# Patient Record
Sex: Female | Born: 1960
Health system: Southern US, Community
[De-identification: ages and names within clinical notes are randomized; demographics above are authoritative.]

## PROBLEM LIST (undated history)

## (undated) DIAGNOSIS — D126 Benign neoplasm of colon, unspecified: Secondary | ICD-10-CM

## (undated) DIAGNOSIS — K219 Gastro-esophageal reflux disease without esophagitis: Secondary | ICD-10-CM

## (undated) DIAGNOSIS — Z9109 Other allergy status, other than to drugs and biological substances: Secondary | ICD-10-CM

## (undated) DIAGNOSIS — R011 Cardiac murmur, unspecified: Secondary | ICD-10-CM

## (undated) DIAGNOSIS — E785 Hyperlipidemia, unspecified: Secondary | ICD-10-CM

## (undated) DIAGNOSIS — T7840XA Allergy, unspecified, initial encounter: Secondary | ICD-10-CM

## (undated) DIAGNOSIS — I10 Essential (primary) hypertension: Secondary | ICD-10-CM

## (undated) HISTORY — DX: Benign neoplasm of colon, unspecified: D12.6

## (undated) HISTORY — PX: UTERINE FIBROID SURGERY: SHX826

## (undated) HISTORY — DX: Cardiac murmur, unspecified: R01.1

## (undated) HISTORY — DX: Allergy, unspecified, initial encounter: T78.40XA

## (undated) HISTORY — PX: COLONOSCOPY W/ POLYPECTOMY: SHX1380

## (undated) HISTORY — DX: Hyperlipidemia, unspecified: E78.5

## (undated) HISTORY — DX: Other allergy status, other than to drugs and biological substances: Z91.09

## (undated) HISTORY — DX: Gastro-esophageal reflux disease without esophagitis: K21.9

## (undated) HISTORY — DX: Essential (primary) hypertension: I10

---

## 1998-05-18 ENCOUNTER — Ambulatory Visit (HOSPITAL_COMMUNITY): Admission: RE | Admit: 1998-05-18 | Discharge: 1998-05-18 | Payer: Self-pay | Admitting: Obstetrics & Gynecology

## 2000-06-19 ENCOUNTER — Other Ambulatory Visit: Admission: RE | Admit: 2000-06-19 | Discharge: 2000-06-19 | Payer: Self-pay | Admitting: Obstetrics & Gynecology

## 2002-07-17 ENCOUNTER — Other Ambulatory Visit: Admission: RE | Admit: 2002-07-17 | Discharge: 2002-07-17 | Payer: Self-pay | Admitting: Obstetrics & Gynecology

## 2003-07-23 ENCOUNTER — Other Ambulatory Visit: Admission: RE | Admit: 2003-07-23 | Discharge: 2003-07-23 | Payer: Self-pay | Admitting: Obstetrics and Gynecology

## 2003-12-25 ENCOUNTER — Ambulatory Visit (HOSPITAL_COMMUNITY): Admission: RE | Admit: 2003-12-25 | Discharge: 2003-12-25 | Payer: Self-pay | Admitting: Obstetrics and Gynecology

## 2004-08-06 ENCOUNTER — Other Ambulatory Visit: Admission: RE | Admit: 2004-08-06 | Discharge: 2004-08-06 | Payer: Self-pay | Admitting: Obstetrics and Gynecology

## 2005-08-11 ENCOUNTER — Other Ambulatory Visit: Admission: RE | Admit: 2005-08-11 | Discharge: 2005-08-11 | Payer: Self-pay | Admitting: Obstetrics and Gynecology

## 2006-09-12 DIAGNOSIS — R87619 Unspecified abnormal cytological findings in specimens from cervix uteri: Secondary | ICD-10-CM | POA: Insufficient documentation

## 2006-09-20 ENCOUNTER — Encounter: Admission: RE | Admit: 2006-09-20 | Discharge: 2006-09-20 | Payer: Self-pay | Admitting: Obstetrics & Gynecology

## 2013-02-04 ENCOUNTER — Encounter: Payer: Self-pay | Admitting: Internal Medicine

## 2013-03-08 DIAGNOSIS — D126 Benign neoplasm of colon, unspecified: Secondary | ICD-10-CM

## 2013-03-08 HISTORY — DX: Benign neoplasm of colon, unspecified: D12.6

## 2013-03-22 ENCOUNTER — Encounter: Payer: Self-pay | Admitting: Internal Medicine

## 2013-03-22 ENCOUNTER — Ambulatory Visit (AMBULATORY_SURGERY_CENTER): Payer: Managed Care, Other (non HMO)

## 2013-03-22 DIAGNOSIS — Z1211 Encounter for screening for malignant neoplasm of colon: Secondary | ICD-10-CM

## 2013-03-22 MED ORDER — MOVIPREP 100 G PO SOLR: 1.0000 | Freq: Once | ORAL | Status: DC

## 2013-04-04 ENCOUNTER — Ambulatory Visit (AMBULATORY_SURGERY_CENTER): Payer: Managed Care, Other (non HMO) | Admitting: Internal Medicine

## 2013-04-04 ENCOUNTER — Encounter: Payer: Self-pay | Admitting: Internal Medicine

## 2013-04-04 VITALS — BP 134/83 | HR 59 | Temp 98.1°F | Resp 33 | Ht 65.0 in | Wt 195.0 lb

## 2013-04-04 DIAGNOSIS — Z1211 Encounter for screening for malignant neoplasm of colon: Secondary | ICD-10-CM

## 2013-04-04 DIAGNOSIS — D126 Benign neoplasm of colon, unspecified: Secondary | ICD-10-CM

## 2013-04-04 MED ORDER — SODIUM CHLORIDE 0.9 % IV SOLN: 500.0000 mL | INTRAVENOUS | Status: DC

## 2013-04-04 NOTE — Op Note (Signed)
[facility] [address] [place], [address]   COLONOSCOPY PROCEDURE REPORT  PATIENT: [name], [name]  MR#: [id number] BIRTHDATE: [birth year] , 52  yrs. old GENDER: Female ENDOSCOPIST: Beverley Fiedler, MD REFERRED BY: PROCEDURE DATE:  04/04/2013 PROCEDURE:   Colonoscopy with cold biopsy polypectomy and Colonoscopy with snare polypectomy First Screening Colonoscopy - Avg.  risk and is 50 yrs.  old or older Yes.  Prior Negative Screening - Now for repeat screening. N/A  History of Adenoma - Now for follow-up colonoscopy & has been > or = to 3 yrs.  N/A  Polyps Removed Today? Yes. ASA CLASS:   Class II INDICATIONS:average risk screening and first colonoscopy. MEDICATIONS: MAC sedation, administered by CRNA and propofol (Diprivan) 250mg  IV  DESCRIPTION OF PROCEDURE:   After the risks benefits and alternatives of the procedure were thoroughly explained, informed consent was obtained.  A digital rectal exam revealed no rectal mass.   The LB [id number]  endoscope was introduced through the anus and advanced to the cecum, which was identified by both the appendix and ileocecal valve. No adverse events experienced. The quality of the prep was good, using MoviPrep  The instrument was then slowly withdrawn as the colon was fully examined.   COLON FINDINGS: Two sessile polyps measuring 3 mm in size were found at the cecum.  Polypectomy was performed with cold forceps.  All resections were complete and all polyp tissue was completely retrieved.   A sessile polyp measuring 5 mm in size was found in the sigmoid colon.  A polypectomy was performed with a cold snare. The resection was complete and the polyp tissue was completely retrieved.   The colon mucosa was otherwise normal.  Retroflexed views revealed no abnormalities. The time to cecum=4 minutes 10 seconds.  Withdrawal time=9 minutes 03 seconds.  The scope was withdrawn and the procedure completed. COMPLICATIONS:  There were no complications.  ENDOSCOPIC IMPRESSION: 1.   Two sessile polyps measuring 3 mm in size were found at the cecum; Polypectomy was performed with cold forceps 2.   Sessile polyp measuring 5 mm in size was found in the sigmoid colon; polypectomy was performed with a cold snare 3.   The colon mucosa was otherwise normal  RECOMMENDATIONS: 1.  Hold aspirin, aspirin products, and anti-inflammatory medication for 1 week. 2.  Await pathology results 3.  If the polyps removed today are proven to be adenomatous (pre-cancerous) polyps, you will need a repeat colonoscopy in 3-5 years.  Otherwise you should continue to follow colorectal cancer screening guidelines for "routine risk" patients with colonoscopy in 10 years.  You will receive a letter within 1-2 weeks with the results of your biopsy as well as final recommendations.  Please call my office if you have not received a letter after 3 weeks.   eSigned:  Beverley Fiedler, MD 04/04/2013 11:21 AM   cc: Bennie Pierini, NP and Julio Sicks, [facility]   PATIENT NAME:  Glenda Rocha, Glenda Rocha MR#: [id number]

## 2013-04-04 NOTE — Progress Notes (Addendum)
Pt ambulated to bath room prior to discharge with husband at her side. No problems, no distress per pt. ewm  Patient did not experience any of the following events: a burn prior to discharge; a fall within the facility; wrong site/side/patient/procedure/implant event; or a hospital transfer or hospital admission upon discharge from the facility. ([id number])Patient did not have preoperative order for IV antibiotic SSI prophylaxis. [id number]) ewm

## 2013-04-04 NOTE — Progress Notes (Signed)
Report to pacu rn, vss, bbs=clear 

## 2013-04-04 NOTE — Patient Instructions (Addendum)
YOU HAD AN ENDOSCOPIC PROCEDURE TODAY AT THE [facility]: Refer to the procedure report that was given to you for any specific questions about what was found during the examination.  If the procedure report does not answer your questions, please call your gastroenterologist to clarify.  If you requested that your care partner not be given the details of your procedure findings, then the procedure report has been included in a sealed envelope for you to review at your convenience later.  YOU SHOULD EXPECT: Some feelings of bloating in the abdomen. Passage of more gas than usual.  Walking can help get rid of the air that was put into your GI tract during the procedure and reduce the bloating. If you had a lower endoscopy (such as a colonoscopy or flexible sigmoidoscopy) you may notice spotting of blood in your stool or on the toilet paper. If you underwent a bowel prep for your procedure, then you may not have a normal bowel movement for a few days.  DIET: Your first meal following the procedure should be a light meal and then it is ok to progress to your normal diet.  A half-sandwich or bowl of soup is an example of a good first meal.  Heavy or fried foods are harder to digest and may make you feel nauseous or bloated.  Likewise meals heavy in dairy and vegetables can cause extra gas to form and this can also increase the bloating.  Drink plenty of fluids but you should avoid alcoholic beverages for 24 hours.  ACTIVITY: Your care partner should take you home directly after the procedure.  You should plan to take it easy, moving slowly for the rest of the day.  You can resume normal activity the day after the procedure however you should NOT DRIVE or use heavy machinery for 24 hours (because of the sedation medicines used during the test).    SYMPTOMS TO REPORT IMMEDIATELY: A gastroenterologist can be reached at any hour.  During normal business hours, 8:30 AM to 5:00 PM Monday through Friday,  call [phone].  After hours and on weekends, please call the GI answering service at [phone]  Emergency number who will take a message and have the physician on call contact you.   Following lower endoscopy (colonoscopy or flexible sigmoidoscopy):  Excessive amounts of blood in the stool  Significant tenderness or worsening of abdominal pains  Swelling of the abdomen that is new, acute  Fever of 100F or higher  FOLLOW UP: If any biopsies were taken you will be contacted by phone or by letter within the next 1-3 weeks.  Call your gastroenterologist if you have not heard about the biopsies in 3 weeks.  Our staff will call the home number listed on your records the next business day following your procedure to check on you and address any questions or concerns that you may have at that time regarding the information given to you following your procedure. This is a courtesy call and so if there is no answer at the home number and we have not heard from you through the emergency physician on call, we will assume that you have returned to your regular daily activities without incident.  SIGNATURES/CONFIDENTIALITY: You and/or your care partner have signed paperwork which will be entered into your electronic medical record.  These signatures attest to the fact that that the information above on your After Visit Summary has been reviewed and is understood.  Full responsibility of the   confidentiality of this discharge information lies with you and/or your care-partner.  Handout on polyps  Please hold aspirin, all aspirin products, and all anti inflammatory medicines like advil, motrin, aleve, bc powders, goody's  for 1 week. Use tylenol only for pain. You may resume these medicines 04-11-13.

## 2013-04-04 NOTE — Progress Notes (Signed)
Called to room to assist during endoscopic procedure.  Patient ID and intended procedure confirmed with present staff. Received instructions for my participation in the procedure from the performing physician.  

## 2013-04-05 ENCOUNTER — Telehealth: Payer: Self-pay | Admitting: *Deleted

## 2013-04-05 NOTE — Telephone Encounter (Signed)
Name identifier, Left message, follow-up 

## 2013-04-11 ENCOUNTER — Encounter: Payer: Self-pay | Admitting: Internal Medicine

## 2013-04-24 ENCOUNTER — Telehealth: Payer: Self-pay | Admitting: *Deleted

## 2013-04-24 NOTE — Telephone Encounter (Signed)
Pt reports she never received any info about her COLON. Explained path letter to pt and mailed her a copy along with the path and procedure letter.

## 2013-11-21 ENCOUNTER — Ambulatory Visit (INDEPENDENT_AMBULATORY_CARE_PROVIDER_SITE_OTHER): Payer: Managed Care, Other (non HMO) | Admitting: Gastroenterology

## 2013-11-21 ENCOUNTER — Encounter: Payer: Self-pay | Admitting: Gastroenterology

## 2013-11-21 ENCOUNTER — Other Ambulatory Visit (INDEPENDENT_AMBULATORY_CARE_PROVIDER_SITE_OTHER): Payer: Managed Care, Other (non HMO)

## 2013-11-21 ENCOUNTER — Telehealth: Payer: Self-pay | Admitting: Internal Medicine

## 2013-11-21 VITALS — BP 132/74 | HR 70 | Ht 65.0 in | Wt 206.0 lb

## 2013-11-21 DIAGNOSIS — K625 Hemorrhage of anus and rectum: Secondary | ICD-10-CM

## 2013-11-21 DIAGNOSIS — R102 Pelvic and perineal pain: Secondary | ICD-10-CM

## 2013-11-21 LAB — BASIC METABOLIC PANEL
BUN: 18 mg/dL (ref 6–23)
CALCIUM: 9.2 mg/dL (ref 8.4–10.5)
CHLORIDE: 103 meq/L (ref 96–112)
CO2: 29 mEq/L (ref 19–32)
Creatinine, Ser: 1 mg/dL (ref 0.4–1.2)
GFR: 60.08 mL/min (ref >60.00)
Glucose, Bld: 78 mg/dL (ref 70–99)
Potassium: 4.4 mEq/L (ref 3.5–5.1)
SODIUM: 138 meq/L (ref 135–145)

## 2013-11-21 LAB — URINALYSIS
BILIRUBIN URINE: NEGATIVE
Hgb urine dipstick: NEGATIVE
Ketones, ur: NEGATIVE
LEUKOCYTES UA: NEGATIVE
NITRITE: NEGATIVE
PH: 5.5 (ref 5.0–8.0)
Specific Gravity, Urine: 1.025 (ref 1.000–1.030)
Total Protein, Urine: NEGATIVE
URINE GLUCOSE: NEGATIVE
Urobilinogen, UA: 0.2 (ref 0.0–1.0)

## 2013-11-21 LAB — CBC
HEMATOCRIT: 42 % (ref 36.0–46.0)
Hemoglobin: 14.5 g/dL (ref 12.0–15.0)
MCHC: 34.4 g/dL (ref 30.0–36.0)
MCV: 90.1 fl (ref 78.0–100.0)
Platelets: 282 10*3/uL (ref 150.0–400.0)
RBC: 4.66 Mil/uL (ref 3.87–5.11)
RDW: 13.1 % (ref 11.5–14.6)
WBC: 11.4 10*3/uL — AB (ref 4.5–10.5)

## 2013-11-21 MED ORDER — HYDROCORTISONE ACETATE 25 MG RE SUPP: 25.0000 mg | Freq: Two times a day (BID) | RECTAL | Status: DC

## 2013-11-21 NOTE — Telephone Encounter (Signed)
Patient with occasional lower abdominal cramping and rectal bleeding.  She reports bleeding with large BM.  She will come in and see Alonza Bogus, PA today at 3:00

## 2013-11-21 NOTE — Progress Notes (Addendum)
11/21/2013 Glenda Rocha [id number] [birth year]   History of Present Illness:  This is a pleasant 53 year old female who is known to Dr. Hilarie Fredrickson for screening colonoscopy only.  She underwent that procedure in August 2014 at which time she had 2 polyps removed from the cecum and one removed from the sigmoid colon; they were tubular adenomas on pathology.  She comes our office today stating that ever since the colonoscopy she has been experiencing rectal bleeding as described as bright red in color. This occurs with bowel movements only.  It is not with every bowel movement, but occurs approximately one out of 3 bowel movements, usually with larger bowel movements. She denies any rectal pain.  She states that this morning she had 1 episode of a moderate amount of bleeding. She says that she has never experienced problems with rectal bleeding or hemorrhoids in the past.  While she is here she also mentions that she has some lower abdominal cramping/pressure that feels like menstrual cramps. She also says that she has a sensation she needs to urinate and will set on the toilet with a lot of pressure built up but then had a lot of hesitation with her urination. This is very unusual because she normally urinates every hour.  She has an appointment with her GYN next month as well.   Current Medications, Allergies, Past Medical History, Past Surgical History, Family History and Social History were reviewed in [employer] record.   Physical Exam: BP 132/74  Pulse 70  Ht 5\' 5"  (1.651 m)  Wt 206 lb (93.441 kg)  BMI 34.28 kg/m2  LMP 11/10/2013 General: Well developed white female in no acute distress Head: Normocephalic and atraumatic Eyes: Sclerae anicteric, conjunctiva pink  Ears: Normal auditory acuity Lungs: Clear throughout to auscultation Heart: Regular rate and rhythm Abdomen: Soft, non-distended.  Normal bowel sounds.  Diffuse lower abdominal TTP without R/R/G. Rectal:   Small non-bleeding external hemorrhoid noted.  DRE did not reveal any mass and there was some soft stool in the rectal vault.  There was some light brown stool with some light colored blood on the exam glove and it was heme positive.  Anoscopy revealed some internal hemorrhoids but none that looked actively bleeding. Musculoskeletal: Symmetrical with no gross deformities  Extremities: No edema  Neurological: Alert oriented x 4, grossly non-focal Psychological:  Alert and cooperative. Normal mood and affect  Assessment and Recommendations: -Rectal bleeding:  Likely hemorrhoidal (internal hemorrhoids noted on anoscopy, but nothing that looked actively bleeding).  Will treat topically with hydrocortisone suppositories BID for 2 weeks.  Will check CBC. -Pelvic pain and hesitation/discomfort with urination:  Doubt that this is related to her rectal bleeding.  She has an appt with her GYN next month but in the interim we will order transabdominal and transvaginal ultrasounds of the pelvis.  Will check a urinalysis and BMP as well.  *Follow-up in two weeks.  If no improvement in symptoms then may want to consider flex sig to reassess the area.  Addendum: Reviewed and agree with initial management. Agree with the above, and if after the workup as outlined above this is found to be internal hemorrhoids, then she would be a candidate for hemorrhoidal band ligation. However, if that no hemorrhoids were seen at colonoscopy, flex sig may be pursued 1st Jerene Bears, MD

## 2013-11-21 NOTE — Patient Instructions (Addendum)
You have been scheduled for an pelvic and transvaginal ultrasound at [facility] (1st floor of hospital) on 11-22-2013 at 3 pm. Please arrive 15 minutes prior to your appointment for registration. Should you need to reschedule your appointment, please contact radiology at [phone]. This test typically takes about 30 minutes to perform.  Please make sure you have a full bladder _______________________________________________________________________________________ You have a follow up visit scheduled with Janett Billow for 12-05-2013 at 103 am  We have sent the following medications to your pharmacy for you to pick up at your convenience: Anusol Suppositories, please place one suppository rectally twice a day for two weeks   Your physician has requested that you go to the basement for the following lab work before leaving today: Urinalysis CBC BMET

## 2013-11-22 ENCOUNTER — Ambulatory Visit (HOSPITAL_COMMUNITY)
Admission: RE | Admit: 2013-11-22 | Discharge: 2013-11-22 | Disposition: A | Discharge status: Home / Self Care | Payer: Managed Care, Other (non HMO) | Source: Ambulatory Visit | Attending: Gastroenterology | Admitting: Gastroenterology

## 2013-11-22 DIAGNOSIS — N949 Unspecified condition associated with female genital organs and menstrual cycle: Secondary | ICD-10-CM | POA: Insufficient documentation

## 2013-11-22 DIAGNOSIS — D259 Leiomyoma of uterus, unspecified: Secondary | ICD-10-CM | POA: Insufficient documentation

## 2013-11-22 DIAGNOSIS — R102 Pelvic and perineal pain: Secondary | ICD-10-CM

## 2013-11-22 DIAGNOSIS — N854 Malposition of uterus: Secondary | ICD-10-CM | POA: Insufficient documentation

## 2013-11-22 DIAGNOSIS — K625 Hemorrhage of anus and rectum: Secondary | ICD-10-CM

## 2013-12-05 ENCOUNTER — Ambulatory Visit: Payer: Managed Care, Other (non HMO) | Admitting: Gastroenterology

## 2013-12-05 ENCOUNTER — Ambulatory Visit (INDEPENDENT_AMBULATORY_CARE_PROVIDER_SITE_OTHER): Payer: Managed Care, Other (non HMO) | Admitting: Gastroenterology

## 2013-12-05 ENCOUNTER — Encounter: Payer: Self-pay | Admitting: Gastroenterology

## 2013-12-05 VITALS — BP 128/80 | HR 68 | Ht 64.25 in | Wt 206.5 lb

## 2013-12-05 DIAGNOSIS — K625 Hemorrhage of anus and rectum: Secondary | ICD-10-CM

## 2013-12-05 DIAGNOSIS — R102 Pelvic and perineal pain: Secondary | ICD-10-CM

## 2013-12-05 NOTE — Progress Notes (Addendum)
12/05/2013 Glenda Rocha [id number] [birth year]   History of Present Illness:  Patient is a pleasant 53 year old female who is known to Dr. Hilarie Fredrickson for screening colonoscopy in 03/2013 at which time she had 2 polyps removed from the cecum and one removed from the sigmoid colon; they were tubular adenomas on pathology.    She was seen by myself on 4/16 for complaints of rectal bleeding intermittently with bowel movements since the time of her colonoscopy.  She was examined and found to have internal and external hemorrhoids.  Was given hydrocortisone suppositories to use; she says that she picked them up but never used them.  She has been soaking her jacuzzi tub, which has helped and she has not experienced any further bleeding in the past 2 weeks.  CBC was normal.  She was also complaining of lower abdominal cramping/pressure, but U/A and BMP were normal.  Pelvic ultrasounds showed a fibroid.  She has an appt with GYN in a couple of weeks.  She says that her abdominal/pelvic pain has improved since going back on her OCP, however.   Current Medications, Allergies, Past Medical History, Past Surgical History, Family History and Social History were reviewed in [employer] record.   Physical Exam: BP 128/80  Pulse 68  Ht 5' 4.25" (1.632 m)  Wt 206 lb 8 oz (93.668 kg)  BMI 35.17 kg/m2  LMP 11/10/2013 General: Well developed white female in no acute distress Head: Normocephalic and atraumatic Eyes:  Sclerae anicteric, conjunctiva pink  Ears: Normal auditory acuity Lungs: Clear throughout to auscultation Heart: Regular rate and rhythm Abdomen: Soft, non-distended.  Normal bowel sounds.  Non-tender. Musculoskeletal: Symmetrical with no gross deformities  Extremities: No edema  Neurological: Alert oriented x 4, grossly non-focal Psychological:  Alert and cooperative. Normal mood and affect  Assessment and Recommendations: -Rectal bleeding:  Likely hemorrhoidal.  Now  resolved.  Will call us back if bleeding recurs and she does have the hydrocortisone suppositories to use if needed as well. -Pelvic pain:  Likely gynecologic.  Pelvic ultrasound showed a fibroid.  U/A was normal.  Has GYN appointment in a couple of weeks.  Addendum: Reviewed and agree with initial management. Jerene Bears, MD

## 2013-12-05 NOTE — Patient Instructions (Signed)
Please follow up as needed 

## 2014-12-25 ENCOUNTER — Other Ambulatory Visit: Payer: Self-pay | Admitting: Obstetrics and Gynecology

## 2014-12-26 LAB — CYTOLOGY - PAP

## 2015-07-07 ENCOUNTER — Other Ambulatory Visit: Payer: Self-pay | Admitting: *Deleted

## 2015-07-07 DIAGNOSIS — I83812 Varicose veins of left lower extremities with pain: Secondary | ICD-10-CM

## 2015-08-25 ENCOUNTER — Encounter: Payer: Self-pay | Admitting: Vascular Surgery

## 2015-09-02 ENCOUNTER — Ambulatory Visit (INDEPENDENT_AMBULATORY_CARE_PROVIDER_SITE_OTHER): Payer: BLUE CROSS/BLUE SHIELD | Admitting: Vascular Surgery

## 2015-09-02 ENCOUNTER — Ambulatory Visit (HOSPITAL_COMMUNITY)
Admission: RE | Admit: 2015-09-02 | Discharge: 2015-09-02 | Disposition: A | Discharge status: Home / Self Care | Payer: BLUE CROSS/BLUE SHIELD | Source: Ambulatory Visit | Attending: Vascular Surgery | Admitting: Vascular Surgery

## 2015-09-02 ENCOUNTER — Encounter: Payer: Self-pay | Admitting: Vascular Surgery

## 2015-09-02 VITALS — BP 149/87 | HR 82 | Temp 97.0°F | Resp 16 | Ht 64.0 in | Wt 209.0 lb

## 2015-09-02 DIAGNOSIS — I83812 Varicose veins of left lower extremities with pain: Secondary | ICD-10-CM | POA: Diagnosis not present

## 2015-09-02 DIAGNOSIS — I872 Venous insufficiency (chronic) (peripheral): Secondary | ICD-10-CM

## 2015-09-02 DIAGNOSIS — R600 Localized edema: Secondary | ICD-10-CM | POA: Diagnosis present

## 2015-09-02 NOTE — Progress Notes (Signed)
[facility] [place]  Patient name: Glenda Rocha MRN: [id number] DOB: 1961-08-04 Sex: female  REASON FOR CONSULT: varicose veins left lower extremity  HPI: Glenda Rocha is a 55 y.o. female, who has had problems with varicose veins of her left leg for about a year and a half. These have gradually progressed. She has no significant symptoms on the right side. She works sitting at [employer] and works for 8-10 hours a day during the week. She experiences some aching and heaviness in her left leg which is aggravated by sitting and standing and relieved with elevation. She denies any previous history of DVT or phlebitis. Her father's side of the family does have some history of varicose veins.  I have reviewed the records from [facility]. The patient does have a history of hyperlipidemia which has been under good control. In addition the patient has benign essential hypertension which has also been under good control.  Renal function is normal  Past Medical History  Diagnosis Date  . Environmental allergies   . Tubular adenoma of colon 03/2013    Family History  Problem Relation Age of Onset  . Colon cancer Paternal Uncle 41  . Colon cancer Paternal Grandmother 60  . Esophageal cancer Neg Hx   . Rectal cancer Neg Hx   . Stomach cancer Neg Hx     SOCIAL HISTORY: Social History   Social History  . Marital Status: Married    Spouse Name: N/A  . Number of Children: N/A  . Years of Education: N/A   Occupational History  . Not on file.   Social History Main Topics  . Smoking status: Never Smoker   . Smokeless tobacco: Never Used  . Alcohol Use: 4.2 oz/week    7 Glasses of wine per week  . Drug Use: No  . Sexual Activity: Not on file   Other Topics Concern  . Not on file   Social History Narrative    Allergies  Allergen Reactions  . Peanuts [Peanut Oil] Itching  . Sulfa Antibiotics     "turnes me purple head to toe"  . Tomato Itching      Current Outpatient Prescriptions  Medication Sig Dispense Refill  . cetirizine (ZYRTEC) 10 MG tablet Take 10 mg by mouth as needed.     Marland Kitchen guaiFENesin (MUCINEX) 600 MG 12 hr tablet Take 1,200 mg by mouth as needed for congestion.    . Multiple Vitamins-Minerals (ONE-A-DAY WOMENS 50 PLUS PO) Take 1 capsule by mouth daily.    Marland Kitchen OVER THE COUNTER MEDICATION megared omega3 krill oil    . OVER THE COUNTER MEDICATION Ocean saline nasal spray 2 sprays per nostril    . Vitamins-Lipotropics (LIPOFLAVONOID) TABS Take 1 tablet by mouth 2 (two) times daily.    . norethindrone-ethinyl estradiol (TRIPHASIL,CYCLAFEM,ALYACEN) 0.5/0.75/1-35 MG-MCG tablet Take 1 tablet by mouth daily. Reported on 09/02/2015     No current facility-administered medications for this visit.    REVIEW OF SYSTEMS:  [X]  denotes positive finding, [ ]  denotes negative finding Cardiac  Comments:  Chest pain or chest pressure:    Shortness of breath upon exertion:    Short of breath when lying flat:    Irregular heart rhythm:        Vascular    Pain in calf, thigh, or hip brought on by ambulation:    Pain in feet at night that wakes you up from your sleep:     Blood  clot in your veins:    Leg swelling:         Pulmonary    Oxygen at home:    Productive cough:     Wheezing:         Neurologic    Sudden weakness in arms or legs:     Sudden numbness in arms or legs:     Sudden onset of difficulty speaking or slurred speech:    Temporary loss of vision in one eye:     Problems with dizziness:         Gastrointestinal    Blood in stool:     Vomited blood:         Genitourinary    Burning when urinating:     Blood in urine:        Psychiatric    Major depression:         Hematologic    Bleeding problems:    Problems with blood clotting too easily:        Skin    Rashes or ulcers:        Constitutional    Fever or chills:      PHYSICAL EXAM: Filed Vitals:   09/02/15 1259 09/02/15 1311 09/02/15 1313   BP: 166/98 147/99 149/87  Pulse: 78 84 82  Temp: 97 F (36.1 C)    TempSrc: Oral    Resp: 16    Height: 5\' 4"  (1.626 m)    Weight: 209 lb (94.802 kg)    SpO2: 98%      GENERAL: The patient is a well-nourished female, in no acute distress. The vital signs are documented above. CARDIAC: There is a regular rate and rhythm.  VASCULAR: do not detect carotid bruits. She has palpable pedal pulses. She has no significant lower extremity swelling. PULMONARY: There is good air exchange bilaterally without wheezing or rales. ABDOMEN: Soft and non-tender with normal pitched bowel sounds.  MUSCULOSKELETAL: There are no major deformities or cyanosis. NEUROLOGIC: No focal weakness or paresthesias are detected. SKIN: There are no ulcers or rashes noted. She has varicose veins along the lateral aspect of her left thigh and left leg. These are under mildly elevated pressure. PSYCHIATRIC: The patient has a normal affect.  DATA:   LOWER EXTREMITY VENOUS DUPLEX: I have independently interpreted the lower extremity venous duplex of the left lower extremity. There is no evidence of DVT in the left lower extremity. There is reflux in the common femoral vein femoral vein and popliteal vein. There is no reflux in the great saphenous vein or small saphenous vein.  MEDICAL ISSUES:  CHRONIC VENOUS INSUFFICIENCY: This patient has reflux in the deep system only and no reflux in the superficial system. Have discussed the importance of intermittent leg elevation in the proper position for this. In addition I have given her a prescription for knee-high compression stockings with a gradient of 15-20 mmHg. I have encouraged her to avoid prolonged sitting and standing. I'm encouraged her to exercise and walk as much as possible. I've also explained that water aerobics is helpful for patients with venous disease. I'll be happy to see her back at any point in the future if her symptoms progress.   Deitra Mayo [facility] [place]: [phone]

## 2016-01-18 DIAGNOSIS — Z6836 Body mass index (BMI) 36.0-36.9, adult: Secondary | ICD-10-CM | POA: Diagnosis not present

## 2016-01-18 DIAGNOSIS — Z01419 Encounter for gynecological examination (general) (routine) without abnormal findings: Secondary | ICD-10-CM | POA: Diagnosis not present

## 2016-01-18 DIAGNOSIS — E6609 Other obesity due to excess calories: Secondary | ICD-10-CM | POA: Diagnosis not present

## 2016-06-23 DIAGNOSIS — N950 Postmenopausal bleeding: Secondary | ICD-10-CM | POA: Diagnosis not present

## 2016-07-05 DIAGNOSIS — N950 Postmenopausal bleeding: Secondary | ICD-10-CM | POA: Diagnosis not present

## 2016-08-04 DIAGNOSIS — J40 Bronchitis, not specified as acute or chronic: Secondary | ICD-10-CM | POA: Diagnosis not present

## 2016-12-15 DIAGNOSIS — J00 Acute nasopharyngitis [common cold]: Secondary | ICD-10-CM | POA: Diagnosis not present

## 2017-01-27 DIAGNOSIS — Z1231 Encounter for screening mammogram for malignant neoplasm of breast: Secondary | ICD-10-CM | POA: Diagnosis not present

## 2017-01-27 DIAGNOSIS — Z01419 Encounter for gynecological examination (general) (routine) without abnormal findings: Secondary | ICD-10-CM | POA: Diagnosis not present

## 2017-01-27 DIAGNOSIS — Z6835 Body mass index (BMI) 35.0-35.9, adult: Secondary | ICD-10-CM | POA: Diagnosis not present

## 2017-03-10 DIAGNOSIS — Z0000 Encounter for general adult medical examination without abnormal findings: Secondary | ICD-10-CM | POA: Diagnosis not present

## 2017-03-10 DIAGNOSIS — I10 Essential (primary) hypertension: Secondary | ICD-10-CM | POA: Diagnosis not present

## 2017-03-17 DIAGNOSIS — Z0000 Encounter for general adult medical examination without abnormal findings: Secondary | ICD-10-CM | POA: Diagnosis not present

## 2017-03-17 DIAGNOSIS — I8390 Asymptomatic varicose veins of unspecified lower extremity: Secondary | ICD-10-CM | POA: Diagnosis not present

## 2017-03-17 DIAGNOSIS — E668 Other obesity: Secondary | ICD-10-CM | POA: Diagnosis not present

## 2017-03-17 DIAGNOSIS — E784 Other hyperlipidemia: Secondary | ICD-10-CM | POA: Diagnosis not present

## 2017-03-17 DIAGNOSIS — I10 Essential (primary) hypertension: Secondary | ICD-10-CM | POA: Diagnosis not present

## 2017-03-17 DIAGNOSIS — R5383 Other fatigue: Secondary | ICD-10-CM | POA: Diagnosis not present

## 2017-05-12 DIAGNOSIS — Z6835 Body mass index (BMI) 35.0-35.9, adult: Secondary | ICD-10-CM | POA: Diagnosis not present

## 2017-05-12 DIAGNOSIS — J40 Bronchitis, not specified as acute or chronic: Secondary | ICD-10-CM | POA: Diagnosis not present

## 2017-09-04 DIAGNOSIS — J329 Chronic sinusitis, unspecified: Secondary | ICD-10-CM | POA: Diagnosis not present

## 2017-09-04 DIAGNOSIS — R05 Cough: Secondary | ICD-10-CM | POA: Diagnosis not present

## 2017-09-04 DIAGNOSIS — Z6836 Body mass index (BMI) 36.0-36.9, adult: Secondary | ICD-10-CM | POA: Diagnosis not present

## 2017-12-28 ENCOUNTER — Encounter: Payer: Self-pay | Admitting: *Deleted

## 2018-01-28 ENCOUNTER — Encounter: Payer: Self-pay | Admitting: Internal Medicine

## 2018-01-30 DIAGNOSIS — R2240 Localized swelling, mass and lump, unspecified lower limb: Secondary | ICD-10-CM | POA: Diagnosis not present

## 2018-01-30 DIAGNOSIS — I8390 Asymptomatic varicose veins of unspecified lower extremity: Secondary | ICD-10-CM | POA: Diagnosis not present

## 2018-01-30 DIAGNOSIS — Z6836 Body mass index (BMI) 36.0-36.9, adult: Secondary | ICD-10-CM | POA: Diagnosis not present

## 2018-01-30 DIAGNOSIS — M545 Low back pain: Secondary | ICD-10-CM | POA: Diagnosis not present

## 2018-02-01 DIAGNOSIS — Z1231 Encounter for screening mammogram for malignant neoplasm of breast: Secondary | ICD-10-CM | POA: Diagnosis not present

## 2018-02-01 DIAGNOSIS — Z01419 Encounter for gynecological examination (general) (routine) without abnormal findings: Secondary | ICD-10-CM | POA: Diagnosis not present

## 2018-02-01 DIAGNOSIS — Z6831 Body mass index (BMI) 31.0-31.9, adult: Secondary | ICD-10-CM | POA: Diagnosis not present

## 2018-02-01 DIAGNOSIS — Z13820 Encounter for screening for osteoporosis: Secondary | ICD-10-CM | POA: Diagnosis not present

## 2018-04-06 DIAGNOSIS — I10 Essential (primary) hypertension: Secondary | ICD-10-CM | POA: Diagnosis not present

## 2018-04-06 DIAGNOSIS — R82998 Other abnormal findings in urine: Secondary | ICD-10-CM | POA: Diagnosis not present

## 2018-04-06 DIAGNOSIS — Z0000 Encounter for general adult medical examination without abnormal findings: Secondary | ICD-10-CM | POA: Diagnosis not present

## 2018-04-13 DIAGNOSIS — Z1389 Encounter for screening for other disorder: Secondary | ICD-10-CM | POA: Diagnosis not present

## 2018-04-13 DIAGNOSIS — M722 Plantar fascial fibromatosis: Secondary | ICD-10-CM | POA: Diagnosis not present

## 2018-04-13 DIAGNOSIS — E7849 Other hyperlipidemia: Secondary | ICD-10-CM | POA: Diagnosis not present

## 2018-04-13 DIAGNOSIS — E668 Other obesity: Secondary | ICD-10-CM | POA: Diagnosis not present

## 2018-04-13 DIAGNOSIS — Z0000 Encounter for general adult medical examination without abnormal findings: Secondary | ICD-10-CM | POA: Diagnosis not present

## 2018-04-13 DIAGNOSIS — I10 Essential (primary) hypertension: Secondary | ICD-10-CM | POA: Diagnosis not present

## 2018-04-20 DIAGNOSIS — Z1212 Encounter for screening for malignant neoplasm of rectum: Secondary | ICD-10-CM | POA: Diagnosis not present

## 2018-05-04 DIAGNOSIS — H35371 Puckering of macula, right eye: Secondary | ICD-10-CM | POA: Diagnosis not present

## 2018-05-04 DIAGNOSIS — H35341 Macular cyst, hole, or pseudohole, right eye: Secondary | ICD-10-CM | POA: Diagnosis not present

## 2018-08-06 DIAGNOSIS — H35341 Macular cyst, hole, or pseudohole, right eye: Secondary | ICD-10-CM | POA: Diagnosis not present

## 2018-08-17 DIAGNOSIS — Z6836 Body mass index (BMI) 36.0-36.9, adult: Secondary | ICD-10-CM | POA: Diagnosis not present

## 2018-08-17 DIAGNOSIS — J0100 Acute maxillary sinusitis, unspecified: Secondary | ICD-10-CM | POA: Diagnosis not present

## 2018-08-22 DIAGNOSIS — I10 Essential (primary) hypertension: Secondary | ICD-10-CM | POA: Diagnosis not present

## 2018-08-22 DIAGNOSIS — J302 Other seasonal allergic rhinitis: Secondary | ICD-10-CM | POA: Diagnosis not present

## 2018-08-22 DIAGNOSIS — J209 Acute bronchitis, unspecified: Secondary | ICD-10-CM | POA: Diagnosis not present

## 2018-08-22 DIAGNOSIS — Z6836 Body mass index (BMI) 36.0-36.9, adult: Secondary | ICD-10-CM | POA: Diagnosis not present

## 2019-03-07 DIAGNOSIS — Z01419 Encounter for gynecological examination (general) (routine) without abnormal findings: Secondary | ICD-10-CM | POA: Diagnosis not present

## 2019-03-07 DIAGNOSIS — Z1231 Encounter for screening mammogram for malignant neoplasm of breast: Secondary | ICD-10-CM | POA: Diagnosis not present

## 2019-03-07 DIAGNOSIS — Z6836 Body mass index (BMI) 36.0-36.9, adult: Secondary | ICD-10-CM | POA: Diagnosis not present

## 2019-03-25 DIAGNOSIS — H35341 Macular cyst, hole, or pseudohole, right eye: Secondary | ICD-10-CM | POA: Diagnosis not present

## 2019-04-29 DIAGNOSIS — E7849 Other hyperlipidemia: Secondary | ICD-10-CM | POA: Diagnosis not present

## 2019-04-29 DIAGNOSIS — I10 Essential (primary) hypertension: Secondary | ICD-10-CM | POA: Diagnosis not present

## 2019-04-29 DIAGNOSIS — Z0000 Encounter for general adult medical examination without abnormal findings: Secondary | ICD-10-CM | POA: Diagnosis not present

## 2019-05-01 DIAGNOSIS — I10 Essential (primary) hypertension: Secondary | ICD-10-CM | POA: Diagnosis not present

## 2019-05-01 DIAGNOSIS — R82998 Other abnormal findings in urine: Secondary | ICD-10-CM | POA: Diagnosis not present

## 2019-05-01 DIAGNOSIS — Z1212 Encounter for screening for malignant neoplasm of rectum: Secondary | ICD-10-CM | POA: Diagnosis not present

## 2019-05-06 DIAGNOSIS — I8390 Asymptomatic varicose veins of unspecified lower extremity: Secondary | ICD-10-CM | POA: Diagnosis not present

## 2019-05-06 DIAGNOSIS — Z0000 Encounter for general adult medical examination without abnormal findings: Secondary | ICD-10-CM | POA: Diagnosis not present

## 2019-05-06 DIAGNOSIS — E669 Obesity, unspecified: Secondary | ICD-10-CM | POA: Diagnosis not present

## 2019-05-06 DIAGNOSIS — E785 Hyperlipidemia, unspecified: Secondary | ICD-10-CM | POA: Diagnosis not present

## 2019-05-06 DIAGNOSIS — Z1331 Encounter for screening for depression: Secondary | ICD-10-CM | POA: Diagnosis not present

## 2019-05-06 DIAGNOSIS — I10 Essential (primary) hypertension: Secondary | ICD-10-CM | POA: Diagnosis not present

## 2019-05-07 ENCOUNTER — Encounter: Payer: Self-pay | Admitting: Internal Medicine

## 2019-06-24 ENCOUNTER — Encounter: Payer: Self-pay | Admitting: Internal Medicine

## 2019-06-24 ENCOUNTER — Ambulatory Visit (AMBULATORY_SURGERY_CENTER): Payer: Self-pay

## 2019-06-24 ENCOUNTER — Other Ambulatory Visit: Payer: Self-pay

## 2019-06-24 VITALS — Temp 96.6°F | Ht 64.5 in | Wt 216.0 lb

## 2019-06-24 DIAGNOSIS — Z86010 Personal history of colonic polyps: Secondary | ICD-10-CM

## 2019-06-24 MED ORDER — NA SULFATE-K SULFATE-MG SULF 17.5-3.13-1.6 GM/177ML PO SOLN: 1.0000 | Freq: Once | ORAL | 0 refills | Status: AC

## 2019-06-24 NOTE — Progress Notes (Signed)
Denies allergies to eggs or soy products. Denies complication of anesthesia or sedation. Denies use of weight loss medication. Denies use of O2.   Emmi instructions given for colonoscopy.  Covid screening is scheduled for Wednesday 07/03/19 @2 :40 Pm. A 15.00 coupon for Suprep was given to the patient.

## 2019-07-03 ENCOUNTER — Other Ambulatory Visit: Payer: Self-pay | Admitting: Internal Medicine

## 2019-07-03 DIAGNOSIS — Z1159 Encounter for screening for other viral diseases: Secondary | ICD-10-CM | POA: Diagnosis not present

## 2019-07-05 LAB — SARS CORONAVIRUS 2 (TAT 6-24 HRS): SARS Coronavirus 2: NEGATIVE

## 2019-07-08 ENCOUNTER — Encounter: Payer: Self-pay | Admitting: Internal Medicine

## 2019-07-08 ENCOUNTER — Other Ambulatory Visit: Payer: Self-pay

## 2019-07-08 ENCOUNTER — Ambulatory Visit (AMBULATORY_SURGERY_CENTER): Payer: BC Managed Care – PPO | Admitting: Internal Medicine

## 2019-07-08 VITALS — BP 124/79 | HR 61 | Temp 98.6°F | Resp 16 | Ht 64.0 in | Wt 216.0 lb

## 2019-07-08 DIAGNOSIS — D123 Benign neoplasm of transverse colon: Secondary | ICD-10-CM

## 2019-07-08 DIAGNOSIS — Z86010 Personal history of colonic polyps: Secondary | ICD-10-CM | POA: Diagnosis not present

## 2019-07-08 DIAGNOSIS — D122 Benign neoplasm of ascending colon: Secondary | ICD-10-CM

## 2019-07-08 DIAGNOSIS — D120 Benign neoplasm of cecum: Secondary | ICD-10-CM | POA: Diagnosis not present

## 2019-07-08 DIAGNOSIS — Z1211 Encounter for screening for malignant neoplasm of colon: Secondary | ICD-10-CM | POA: Diagnosis not present

## 2019-07-08 MED ORDER — SODIUM CHLORIDE 0.9 % IV SOLN: 500.0000 mL | Freq: Once | INTRAVENOUS | Status: DC

## 2019-07-08 NOTE — Progress Notes (Signed)
Called to room to assist during endoscopic procedure.  Patient ID and intended procedure confirmed with present staff. Received instructions for my participation in the procedure from the performing physician.  

## 2019-07-08 NOTE — Op Note (Signed)
[place] Patient Name: Glenda Rocha Procedure Date: 07/08/2019 10:11 AM MRN: [id number] Endoscopist: Jerene Bears , MD Age: 58 Referring MD:  Date of Birth: 25-Jul-1961 Gender: Female Account #: [id number] Procedure:                Colonoscopy Indications:              High risk colon cancer surveillance: Personal                            history of non-advanced adenoma, Last colonoscopy:                            August 2014 Medicines:                Monitored Anesthesia Care Procedure:                Pre-Anesthesia Assessment:                           - Prior to the procedure, a History and Physical                            was performed, and patient medications and                            allergies were reviewed. The patient's tolerance of                            previous anesthesia was also reviewed. The risks                            and benefits of the procedure and the sedation                            options and risks were discussed with the patient.                            All questions were answered, and informed consent                            was obtained. Prior Anticoagulants: The patient has                            taken no previous anticoagulant or antiplatelet                            agents. ASA Grade Assessment: II - A patient with                            mild systemic disease. After reviewing the risks                            and benefits, the patient was deemed in  satisfactory condition to undergo the procedure.                           After obtaining informed consent, the colonoscope                            was passed under direct vision. Throughout the                            procedure, the patient's blood pressure, pulse, and                            oxygen saturations were monitored continuously. The                            Colonoscope was introduced through the anus and                        advanced to the cecum, identified by appendiceal                            orifice and ileocecal valve. The colonoscopy was                            performed without difficulty. The patient tolerated                            the procedure well. The quality of the bowel                            preparation was good. The ileocecal valve,                            appendiceal orifice, and rectum were photographed. Scope In: 10:20:07 AM Scope Out: 10:38:10 AM Scope Withdrawal Time: 0 hours 15 minutes 29 seconds  Total Procedure Duration: 0 hours 18 minutes 3 seconds  Findings:                 The digital rectal exam was normal.                           Three sessile polyps were found in the cecum. The                            polyps were 2 to 3 mm in size. These polyps were                            removed with a cold snare. Resection and retrieval                            were complete.                           A 5 mm polyp was found in the ascending colon. The  polyp was sessile. The polyp was removed with a                            cold snare. Resection and retrieval were complete.                           A 20 mm polyp was found in the transverse colon.                            The polyp was flat. The polyp was removed with a                            piecemeal technique using a cold snare. Resection                            and retrieval were complete.                           The exam was otherwise without abnormality on                            direct and retroflexion views. Complications:            No immediate complications. Estimated Blood Loss:     Estimated blood loss was minimal. Impression:               - Three 2 to 3 mm polyps in the cecum, removed with                            a cold snare. Resected and retrieved.                           - One 5 mm polyp in the ascending colon, removed                             with a cold snare. Resected and retrieved.                           - One 20 mm polyp in the transverse colon, removed                            piecemeal using a cold snare. Resected and                            retrieved.                           - The examination was otherwise normal on direct                            and retroflexion views. Recommendation:           - Patient has a contact number available for  emergencies. The signs and symptoms of potential                            delayed complications were discussed with the                            patient. Return to normal activities tomorrow.                            Written discharge instructions were provided to the                            patient.                           - Resume previous diet.                           - Continue present medications.                           - Await pathology results.                           - Repeat colonoscopy is recommended for                            surveillance. The colonoscopy date will be                            determined after pathology results from today's                            exam become available for review. Jerene Bears, MD 07/08/2019 10:40:54 AM This report has been signed electronically.

## 2019-07-08 NOTE — Progress Notes (Signed)
Pt's states no medical or surgical changes since previsit or office visit. 

## 2019-07-08 NOTE — Progress Notes (Signed)
Report to PACU, RN, vss, BBS= Clear.  

## 2019-07-08 NOTE — Patient Instructions (Signed)
HANDOUTS PROVIDED ON: POLYPS  THE POLYPS TAKEN TODAY HAVE BEEN SENT FOR PATHOLOGY.  THE RESULTS CAN TAKE 2-3 WEEKS TO RECEIVE.  BASED ON THE RESULTS IS WHEN YOUR NEXT COLONOSCOPY WILL BE RECOMMENDED.  YOU MAY RESUME YOUR PREVIOUS DIET AND MEDICATION SCHEDULE.  [place] YOU FOR ALLOWING [place] TO CARE FOR YOU TODAY!!!  YOU HAD AN ENDOSCOPIC PROCEDURE TODAY AT [facility]:   Refer to the procedure report that was given to you for any specific questions about what was found during the examination.  If the procedure report does not answer your questions, please call your gastroenterologist to clarify.  If you requested that your care partner not be given the details of your procedure findings, then the procedure report has been included in a sealed envelope for you to review at your convenience later.  YOU SHOULD EXPECT: Some feelings of bloating in the abdomen. Passage of more gas than usual.  Walking can help get rid of the air that was put into your GI tract during the procedure and reduce the bloating. If you had a lower endoscopy (such as a colonoscopy or flexible sigmoidoscopy) you may notice spotting of blood in your stool or on the toilet paper. If you underwent a bowel prep for your procedure, you may not have a normal bowel movement for a few days.  Please Note:  You might notice some irritation and congestion in your nose or some drainage.  This is from the oxygen used during your procedure.  There is no need for concern and it should clear up in a day or so.  SYMPTOMS TO REPORT IMMEDIATELY:   Following lower endoscopy (colonoscopy or flexible sigmoidoscopy):  Excessive amounts of blood in the stool  Significant tenderness or worsening of abdominal pains  Swelling of the abdomen that is new, acute  Fever of 100F or higher  For urgent or emergent issues, a gastroenterologist can be reached at any hour by calling [phone].   DIET:  We do recommend a small meal at  first, but then you may proceed to your regular diet.  Drink plenty of fluids but you should avoid alcoholic beverages for 24 hours.  ACTIVITY:  You should plan to take it easy for the rest of today and you should NOT DRIVE or use heavy machinery until tomorrow (because of the sedation medicines used during the test).    FOLLOW UP: Our staff will call the number listed on your records 48-72 hours following your procedure to check on you and address any questions or concerns that you may have regarding the information given to you following your procedure. If we do not reach you, we will leave a message.  We will attempt to reach you two times.  During this call, we will ask if you have developed any symptoms of COVID 19. If you develop any symptoms (ie: fever, flu-like symptoms, shortness of breath, cough etc.) before then, please call [phone].  If you test positive for Covid 19 in the 2 weeks post procedure, please call and report this information to [place].    If any biopsies were taken you will be contacted by phone or by letter within the next 1-3 weeks.  Please call us at [phone] if you have not heard about the biopsies in 3 weeks.    SIGNATURES/CONFIDENTIALITY: You and/or your care partner have signed paperwork which will be entered into your electronic medical record.  These signatures attest to the fact that that  the information above on your After Visit Summary has been reviewed and is understood.  Full responsibility of the confidentiality of this discharge information lies with you and/or your care-partner.

## 2019-07-10 ENCOUNTER — Telehealth: Payer: Self-pay

## 2019-07-10 NOTE — Telephone Encounter (Signed)
1st follow up call made.  NALM 

## 2019-07-10 NOTE — Telephone Encounter (Signed)
Second attempt call back to pt, lm on vm.

## 2019-07-14 ENCOUNTER — Encounter: Payer: Self-pay | Admitting: Internal Medicine

## 2019-09-30 DIAGNOSIS — H35341 Macular cyst, hole, or pseudohole, right eye: Secondary | ICD-10-CM | POA: Diagnosis not present

## 2020-04-01 DIAGNOSIS — Z6834 Body mass index (BMI) 34.0-34.9, adult: Secondary | ICD-10-CM | POA: Diagnosis not present

## 2020-04-01 DIAGNOSIS — Z1231 Encounter for screening mammogram for malignant neoplasm of breast: Secondary | ICD-10-CM | POA: Diagnosis not present

## 2020-04-01 DIAGNOSIS — Z01419 Encounter for gynecological examination (general) (routine) without abnormal findings: Secondary | ICD-10-CM | POA: Diagnosis not present

## 2020-04-21 DIAGNOSIS — H35341 Macular cyst, hole, or pseudohole, right eye: Secondary | ICD-10-CM | POA: Diagnosis not present

## 2020-05-11 DIAGNOSIS — Z13820 Encounter for screening for osteoporosis: Secondary | ICD-10-CM | POA: Diagnosis not present

## 2020-06-08 DIAGNOSIS — Z0000 Encounter for general adult medical examination without abnormal findings: Secondary | ICD-10-CM | POA: Diagnosis not present

## 2020-06-08 DIAGNOSIS — E785 Hyperlipidemia, unspecified: Secondary | ICD-10-CM | POA: Diagnosis not present

## 2020-06-15 DIAGNOSIS — Z0000 Encounter for general adult medical examination without abnormal findings: Secondary | ICD-10-CM | POA: Diagnosis not present

## 2020-06-15 DIAGNOSIS — R82998 Other abnormal findings in urine: Secondary | ICD-10-CM | POA: Diagnosis not present

## 2020-06-15 DIAGNOSIS — I10 Essential (primary) hypertension: Secondary | ICD-10-CM | POA: Diagnosis not present

## 2020-06-17 ENCOUNTER — Other Ambulatory Visit: Payer: Self-pay | Admitting: Internal Medicine

## 2020-06-17 DIAGNOSIS — E785 Hyperlipidemia, unspecified: Secondary | ICD-10-CM

## 2020-07-06 DIAGNOSIS — Z1212 Encounter for screening for malignant neoplasm of rectum: Secondary | ICD-10-CM | POA: Diagnosis not present

## 2020-07-08 ENCOUNTER — Ambulatory Visit
Admission: RE | Admit: 2020-07-08 | Discharge: 2020-07-08 | Disposition: A | Discharge status: Home / Self Care | Payer: No Typology Code available for payment source | Source: Ambulatory Visit | Attending: Internal Medicine | Admitting: Internal Medicine

## 2020-07-08 DIAGNOSIS — E785 Hyperlipidemia, unspecified: Secondary | ICD-10-CM

## 2020-08-12 DIAGNOSIS — I10 Essential (primary) hypertension: Secondary | ICD-10-CM | POA: Diagnosis not present

## 2020-09-21 DIAGNOSIS — H35371 Puckering of macula, right eye: Secondary | ICD-10-CM | POA: Diagnosis not present

## 2020-09-21 DIAGNOSIS — H35341 Macular cyst, hole, or pseudohole, right eye: Secondary | ICD-10-CM | POA: Diagnosis not present

## 2020-09-21 DIAGNOSIS — H43811 Vitreous degeneration, right eye: Secondary | ICD-10-CM | POA: Diagnosis not present

## 2020-11-26 DIAGNOSIS — H9120 Sudden idiopathic hearing loss, unspecified ear: Secondary | ICD-10-CM | POA: Insufficient documentation

## 2020-11-26 DIAGNOSIS — Z6833 Body mass index (BMI) 33.0-33.9, adult: Secondary | ICD-10-CM | POA: Diagnosis not present

## 2020-11-26 DIAGNOSIS — H6982 Other specified disorders of Eustachian tube, left ear: Secondary | ICD-10-CM | POA: Diagnosis not present

## 2020-12-01 ENCOUNTER — Other Ambulatory Visit (HOSPITAL_COMMUNITY): Payer: Self-pay | Admitting: Internal Medicine

## 2020-12-01 DIAGNOSIS — G4452 New daily persistent headache (NDPH): Secondary | ICD-10-CM

## 2020-12-01 DIAGNOSIS — H9192 Unspecified hearing loss, left ear: Secondary | ICD-10-CM | POA: Diagnosis not present

## 2020-12-01 DIAGNOSIS — H9123 Sudden idiopathic hearing loss, bilateral: Secondary | ICD-10-CM

## 2020-12-02 ENCOUNTER — Ambulatory Visit (HOSPITAL_COMMUNITY)
Admission: RE | Admit: 2020-12-02 | Discharge: 2020-12-02 | Disposition: A | Discharge status: Home / Self Care | Payer: BC Managed Care – PPO | Source: Ambulatory Visit | Attending: Internal Medicine | Admitting: Internal Medicine

## 2020-12-02 ENCOUNTER — Other Ambulatory Visit: Payer: Self-pay

## 2020-12-02 DIAGNOSIS — H9123 Sudden idiopathic hearing loss, bilateral: Secondary | ICD-10-CM | POA: Insufficient documentation

## 2020-12-02 DIAGNOSIS — R42 Dizziness and giddiness: Secondary | ICD-10-CM | POA: Diagnosis not present

## 2020-12-02 DIAGNOSIS — R519 Headache, unspecified: Secondary | ICD-10-CM | POA: Diagnosis not present

## 2020-12-02 DIAGNOSIS — H9190 Unspecified hearing loss, unspecified ear: Secondary | ICD-10-CM | POA: Diagnosis not present

## 2020-12-02 DIAGNOSIS — J019 Acute sinusitis, unspecified: Secondary | ICD-10-CM | POA: Diagnosis not present

## 2020-12-02 DIAGNOSIS — G4452 New daily persistent headache (NDPH): Secondary | ICD-10-CM | POA: Diagnosis not present

## 2020-12-02 MED ORDER — GADOBUTROL 1 MMOL/ML IV SOLN
10.0000 mL | Freq: Once | INTRAVENOUS | Status: AC | PRN
  Administered 2020-12-02: 10 mL via INTRAVENOUS

## 2020-12-03 DIAGNOSIS — H903 Sensorineural hearing loss, bilateral: Secondary | ICD-10-CM | POA: Diagnosis not present

## 2020-12-03 DIAGNOSIS — H9120 Sudden idiopathic hearing loss, unspecified ear: Secondary | ICD-10-CM | POA: Insufficient documentation

## 2020-12-03 DIAGNOSIS — H9122 Sudden idiopathic hearing loss, left ear: Secondary | ICD-10-CM | POA: Diagnosis not present

## 2020-12-16 DIAGNOSIS — H9192 Unspecified hearing loss, left ear: Secondary | ICD-10-CM | POA: Diagnosis not present

## 2020-12-16 DIAGNOSIS — I10 Essential (primary) hypertension: Secondary | ICD-10-CM | POA: Diagnosis not present

## 2020-12-17 DIAGNOSIS — H9042 Sensorineural hearing loss, unilateral, left ear, with unrestricted hearing on the contralateral side: Secondary | ICD-10-CM | POA: Diagnosis not present

## 2020-12-17 DIAGNOSIS — H9120 Sudden idiopathic hearing loss, unspecified ear: Secondary | ICD-10-CM | POA: Diagnosis not present

## 2020-12-31 DIAGNOSIS — I10 Essential (primary) hypertension: Secondary | ICD-10-CM | POA: Diagnosis not present

## 2020-12-31 DIAGNOSIS — R5383 Other fatigue: Secondary | ICD-10-CM | POA: Diagnosis not present

## 2020-12-31 DIAGNOSIS — U071 COVID-19: Secondary | ICD-10-CM | POA: Diagnosis not present

## 2021-01-15 DIAGNOSIS — I10 Essential (primary) hypertension: Secondary | ICD-10-CM | POA: Diagnosis not present

## 2021-01-15 DIAGNOSIS — E785 Hyperlipidemia, unspecified: Secondary | ICD-10-CM | POA: Diagnosis not present

## 2021-01-19 DIAGNOSIS — H9120 Sudden idiopathic hearing loss, unspecified ear: Secondary | ICD-10-CM | POA: Diagnosis not present

## 2021-01-19 DIAGNOSIS — H903 Sensorineural hearing loss, bilateral: Secondary | ICD-10-CM | POA: Diagnosis not present

## 2021-03-22 DIAGNOSIS — I10 Essential (primary) hypertension: Secondary | ICD-10-CM | POA: Diagnosis not present

## 2021-03-22 DIAGNOSIS — E785 Hyperlipidemia, unspecified: Secondary | ICD-10-CM | POA: Diagnosis not present

## 2021-04-30 DIAGNOSIS — Z01419 Encounter for gynecological examination (general) (routine) without abnormal findings: Secondary | ICD-10-CM | POA: Diagnosis not present

## 2021-04-30 DIAGNOSIS — Z1231 Encounter for screening mammogram for malignant neoplasm of breast: Secondary | ICD-10-CM | POA: Diagnosis not present

## 2021-04-30 DIAGNOSIS — Z6835 Body mass index (BMI) 35.0-35.9, adult: Secondary | ICD-10-CM | POA: Diagnosis not present

## 2021-07-08 DIAGNOSIS — E785 Hyperlipidemia, unspecified: Secondary | ICD-10-CM | POA: Diagnosis not present

## 2021-07-15 DIAGNOSIS — I10 Essential (primary) hypertension: Secondary | ICD-10-CM | POA: Diagnosis not present

## 2021-07-15 DIAGNOSIS — R82998 Other abnormal findings in urine: Secondary | ICD-10-CM | POA: Diagnosis not present

## 2021-07-15 DIAGNOSIS — Z0000 Encounter for general adult medical examination without abnormal findings: Secondary | ICD-10-CM | POA: Diagnosis not present

## 2021-08-19 DIAGNOSIS — Z411 Encounter for cosmetic surgery: Secondary | ICD-10-CM | POA: Diagnosis not present

## 2021-08-25 DIAGNOSIS — I10 Essential (primary) hypertension: Secondary | ICD-10-CM | POA: Diagnosis not present

## 2021-11-11 IMAGING — MR MR HEAD WO/W CM
14 series · 48 of 48 positions shown · IV contrast (gadavist)
Comparison: No pertinent prior exams available for comparison.

CLINICAL DATA: Sudden hearing loss of both ears. New daily
persistent headache. Additional history provided: Patient reports
sudden onset hearing loss on the left with slight hearing loss on
the right, dizziness since [REDACTED], persistent daily headache.

EXAM:
MRI HEAD WITHOUT AND WITH CONTRAST
TECHNIQUE: Multiplanar, multiecho pulse sequences of the brain and surrounding
structures were obtained without and with intravenous contrast.
CONTRAST:  10mL GADAVIST GADOBUTROL 1 MMOL/ML IV SOLN

[Series 5: DWI · axial · 3.0mm · 1.36mm/px · z∈[-60,+93]mm · 8 of 104 slices shown (1 of 2)]
[im 1/104]
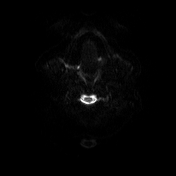
[im 15/104]
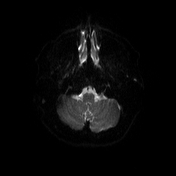
[im 30/104]
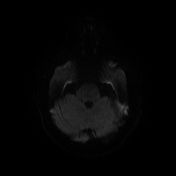
[im 45/104]
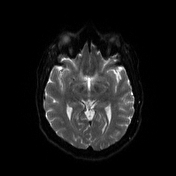
[im 59/104]
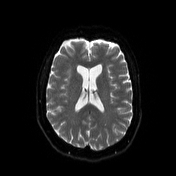
[im 74/104]
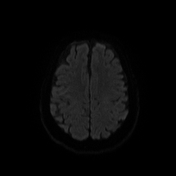
[im 89/104]
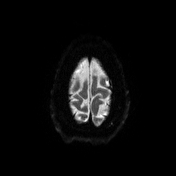
[im 104/104]
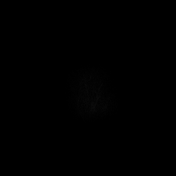

[Series 6: DWI · axial · 3.0mm · 1.36mm/px · z∈[-60,+93]mm · 4 of 52 slices shown (2 of 2)]
[im 1/52]
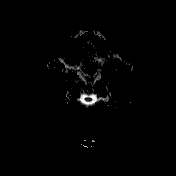
[im 18/52]
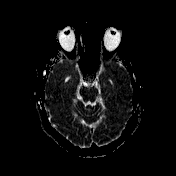
[im 35/52]
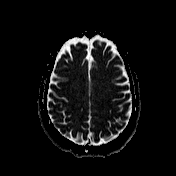
[im 52/52]
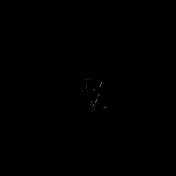

[Series 7: T1 · sagittal · 5.0mm · 0.75mm/px · 2 of 24 slices shown (1 of 3)]
[im 1/24]
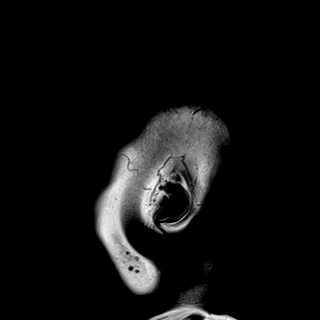
[im 24/24]
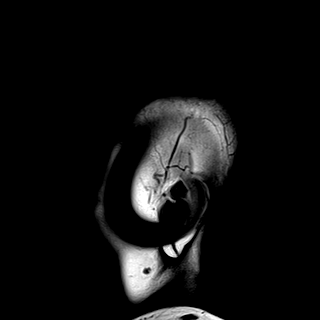

[Series 8: T2 · axial · 5.0mm · 0.62mm/px · z∈[-59,+103]mm · 2 of 26 slices shown]
[im 1/26]
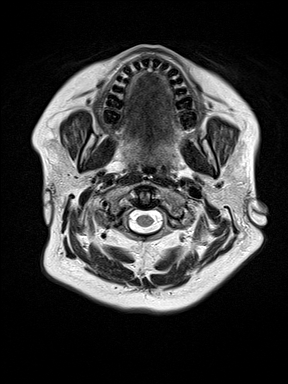
[im 26/26]
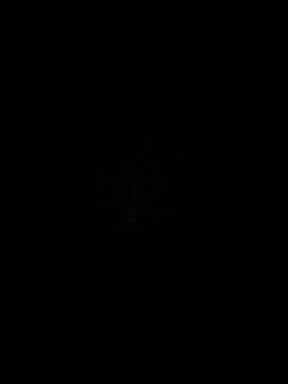

[Series 9: mip_images(sw) · axial · 24.0mm · 0.75mm/px · z∈[-44,+88]mm · 3 of 45 slices shown]
[im 1/45]
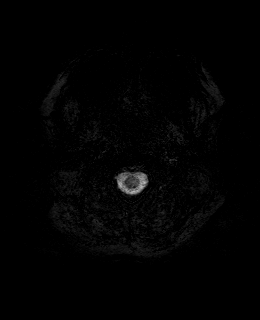
[im 23/45]
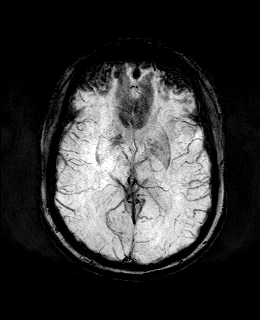
[im 45/45]
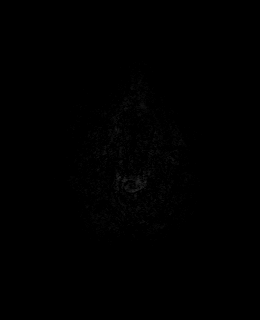

[Series 10: swi_images · axial · 3.0mm · 0.75mm/px · z∈[-54,+99]mm · 4 of 52 slices shown]
[im 1/52]
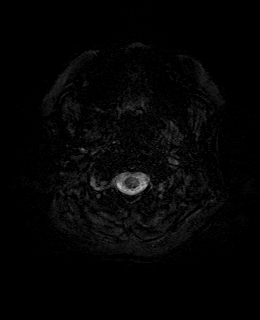
[im 18/52]
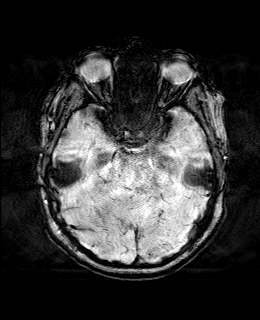
[im 35/52]
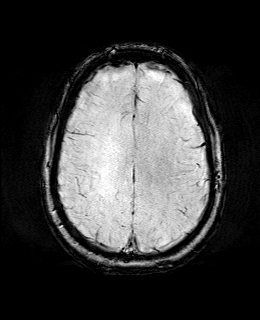
[im 52/52]
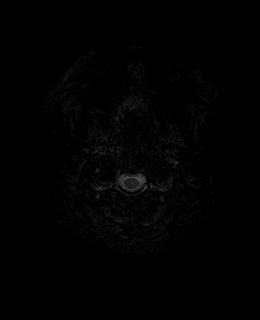

[Series 11: FLAIR · axial · 3.0mm · 0.75mm/px · z∈[-54,+99]mm · 4 of 52 slices shown]
[im 1/52]
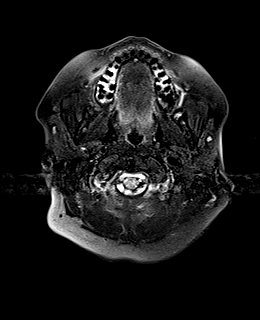
[im 18/52]
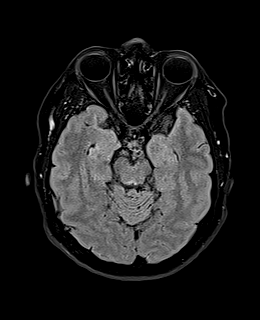
[im 35/52]
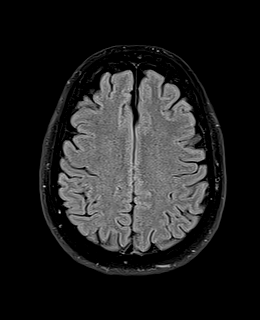
[im 52/52]
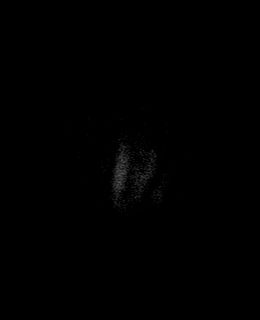

[Series 12: bSSFP · axial · 0.6mm · 0.56mm/px · z∈[-46,-13]mm · 4 of 56 slices shown]
[im 1/56]
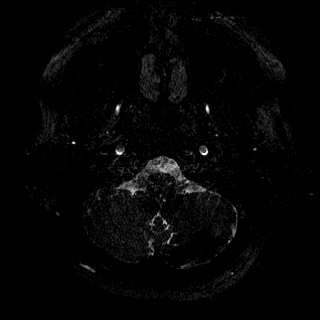
[im 19/56]
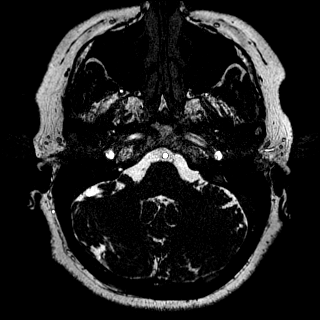
[im 37/56]
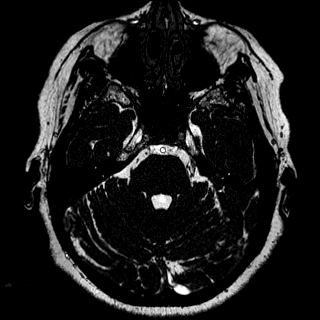
[im 56/56]
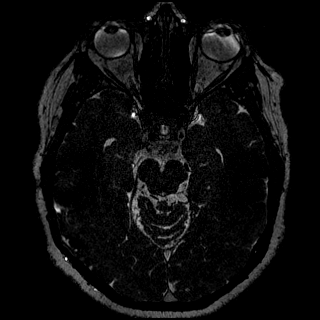

[Series 13: T1 · axial · 3.0mm · 0.31mm/px · 1 of 15 slices shown (2 of 3)]
[im 1/15]
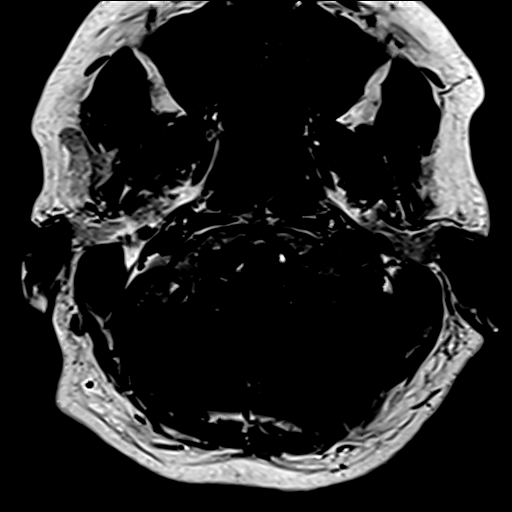

[Series 14: T1 · coronal · 3.0mm · 0.50mm/px · 1 of 15 slices shown (3 of 3)]
[im 1/15]
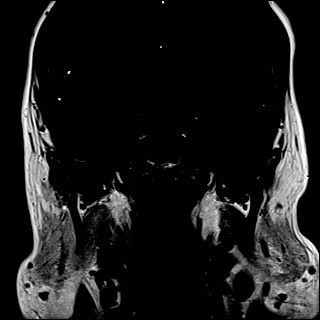

[Series 15: T1 post-contrast · coronal · 3.0mm · 0.50mm/px · 1 of 15 slices shown (1 of 3)]
[im 1/15]
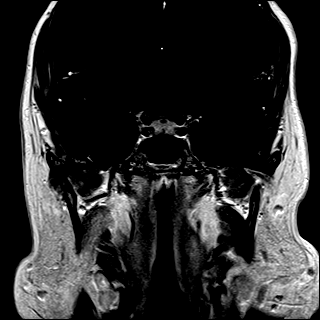

[Series 16: T1 post-contrast · axial · 3.0mm · 0.31mm/px · 1 of 15 slices shown (2 of 3)]
[im 1/15]
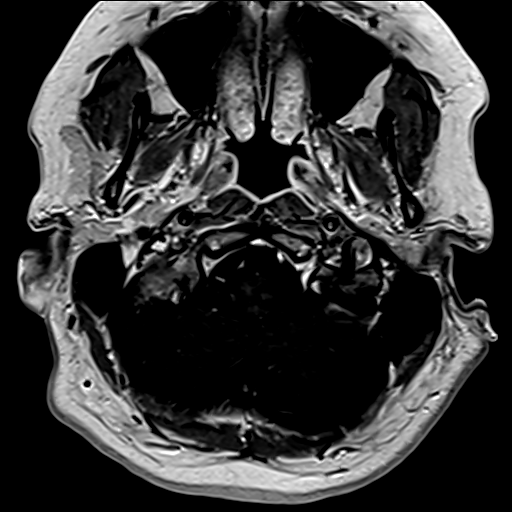

[Series 17: T1 post-contrast · axial · 1.0mm · 0.94mm/px · z∈[-57,+102]mm · 12 of 160 slices shown (3 of 3)]
[im 1/160]
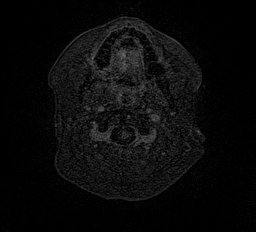
[im 15/160]
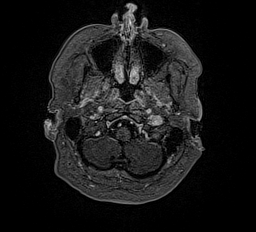
[im 29/160]
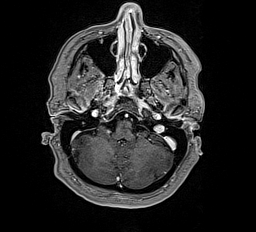
[im 44/160]
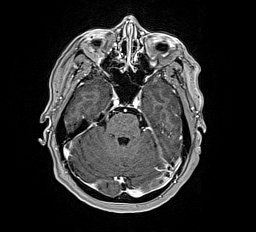
[im 58/160]
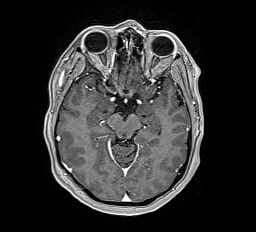
[im 73/160]
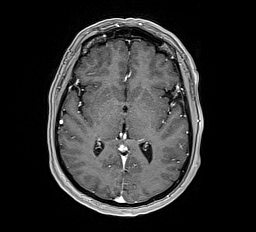
[im 87/160]
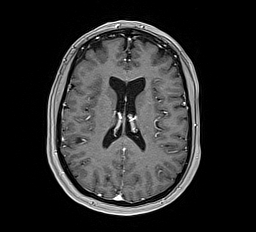
[im 102/160]
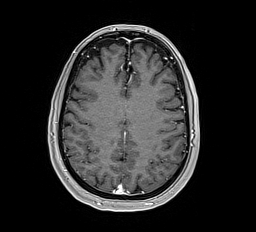
[im 116/160]
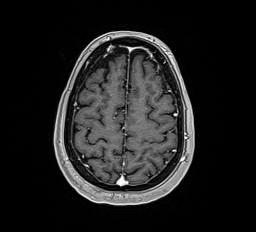
[im 131/160]
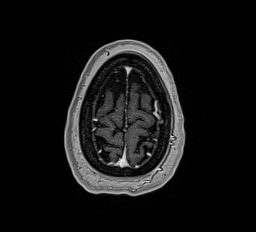
[im 145/160]
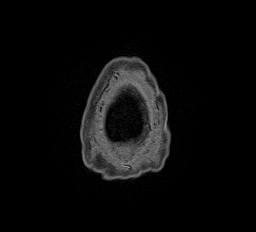
[im 160/160]
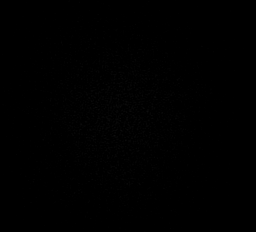

[Series 100: <mpr range> · axial · 0.6mm · 0.37mm/px · 1 of 13 slices shown]
[im 1/13]
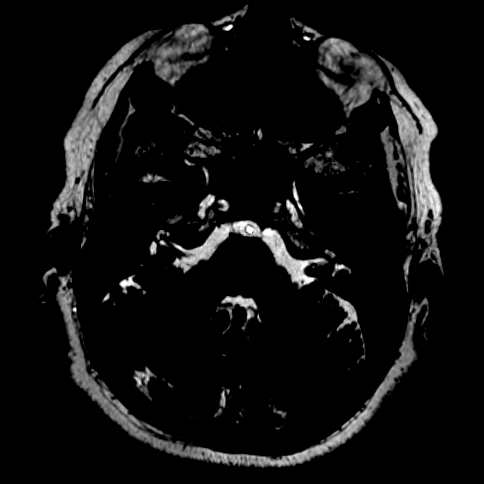

[48 of 48 positions shown; findings below may reference images not displayed]

FINDINGS: Brain:

Cerebral volume is normal.

No focal parenchymal signal abnormality is identified.

No evidence of an intracranial mass. Specifically, no
cerebellopontine angle or internal auditory canal mass is
demonstrated. Unremarkable appearance of the seventh and eighth
cranial nerves bilaterally.

There is no acute infarct.

No evidence of intracranial mass.

No chronic intracranial blood products.

No extra-axial fluid collection.

No midline shift.

No abnormal intracranial enhancement.

Vascular: Expected proximal arterial flow voids.

Skull and upper cervical spine: No focal marrow lesion.

Sinuses/Orbits: Visualized orbits show no acute finding. Trace
bilateral ethmoid sinus mucosal thickening. Small left maxillary
sinus mucous retention cyst.
IMPRESSION: Unremarkable MRI appearance of the brain. No evidence of acute
intracranial abnormality.

No cerebellopontine angle or internal auditory canal mass.

Mild paranasal sinus disease as described.

## 2021-11-19 DIAGNOSIS — H35341 Macular cyst, hole, or pseudohole, right eye: Secondary | ICD-10-CM | POA: Diagnosis not present

## 2021-11-19 DIAGNOSIS — H5203 Hypermetropia, bilateral: Secondary | ICD-10-CM | POA: Diagnosis not present

## 2021-11-19 DIAGNOSIS — H40053 Ocular hypertension, bilateral: Secondary | ICD-10-CM | POA: Diagnosis not present

## 2021-11-19 DIAGNOSIS — H524 Presbyopia: Secondary | ICD-10-CM | POA: Diagnosis not present

## 2021-11-19 DIAGNOSIS — H52223 Regular astigmatism, bilateral: Secondary | ICD-10-CM | POA: Diagnosis not present

## 2021-11-19 DIAGNOSIS — H43811 Vitreous degeneration, right eye: Secondary | ICD-10-CM | POA: Diagnosis not present

## 2021-11-19 DIAGNOSIS — H35371 Puckering of macula, right eye: Secondary | ICD-10-CM | POA: Diagnosis not present

## 2022-01-12 DIAGNOSIS — I10 Essential (primary) hypertension: Secondary | ICD-10-CM | POA: Diagnosis not present

## 2022-03-16 ENCOUNTER — Ambulatory Visit: Payer: BC Managed Care – PPO | Admitting: Internal Medicine

## 2022-04-13 DIAGNOSIS — R059 Cough, unspecified: Secondary | ICD-10-CM | POA: Diagnosis not present

## 2022-08-10 DIAGNOSIS — E785 Hyperlipidemia, unspecified: Secondary | ICD-10-CM | POA: Diagnosis not present

## 2022-08-10 DIAGNOSIS — I10 Essential (primary) hypertension: Secondary | ICD-10-CM | POA: Diagnosis not present

## 2022-08-17 DIAGNOSIS — R82998 Other abnormal findings in urine: Secondary | ICD-10-CM | POA: Diagnosis not present

## 2022-08-17 DIAGNOSIS — I10 Essential (primary) hypertension: Secondary | ICD-10-CM | POA: Diagnosis not present

## 2022-08-17 DIAGNOSIS — Z0000 Encounter for general adult medical examination without abnormal findings: Secondary | ICD-10-CM | POA: Diagnosis not present

## 2022-08-22 ENCOUNTER — Encounter: Payer: Self-pay | Admitting: Internal Medicine

## 2022-08-30 ENCOUNTER — Encounter: Payer: Self-pay | Admitting: Internal Medicine

## 2022-09-06 DIAGNOSIS — Z124 Encounter for screening for malignant neoplasm of cervix: Secondary | ICD-10-CM | POA: Diagnosis not present

## 2022-09-06 DIAGNOSIS — Z01419 Encounter for gynecological examination (general) (routine) without abnormal findings: Secondary | ICD-10-CM | POA: Diagnosis not present

## 2022-09-06 DIAGNOSIS — Z13820 Encounter for screening for osteoporosis: Secondary | ICD-10-CM | POA: Diagnosis not present

## 2022-09-06 DIAGNOSIS — Z6837 Body mass index (BMI) 37.0-37.9, adult: Secondary | ICD-10-CM | POA: Diagnosis not present

## 2022-09-06 DIAGNOSIS — Z1231 Encounter for screening mammogram for malignant neoplasm of breast: Secondary | ICD-10-CM | POA: Diagnosis not present

## 2022-09-28 ENCOUNTER — Ambulatory Visit (AMBULATORY_SURGERY_CENTER): Payer: BC Managed Care – PPO

## 2022-09-28 VITALS — Ht 64.0 in | Wt 210.0 lb

## 2022-09-28 DIAGNOSIS — I10 Essential (primary) hypertension: Secondary | ICD-10-CM | POA: Insufficient documentation

## 2022-09-28 DIAGNOSIS — Z86010 Personal history of colonic polyps: Secondary | ICD-10-CM

## 2022-09-28 DIAGNOSIS — Z800 Family history of malignant neoplasm of digestive organs: Secondary | ICD-10-CM

## 2022-09-28 DIAGNOSIS — K219 Gastro-esophageal reflux disease without esophagitis: Secondary | ICD-10-CM | POA: Insufficient documentation

## 2022-09-28 MED ORDER — NA SULFATE-K SULFATE-MG SULF 17.5-3.13-1.6 GM/177ML PO SOLN: 1.0000 | Freq: Once | ORAL | 0 refills | Status: AC

## 2022-09-28 NOTE — Progress Notes (Signed)
No egg or soy allergy known to patient  No issues known to pt with past sedation with any surgeries or procedures Patient denies ever being told they had issues or difficulty with intubation  No FH of Malignant Hyperthermia Pt is not on diet pills Pt is not on  home 02  Pt is not on blood thinners  Pt with chronic constipation. Report having a BM only a couple times a week with intermittent straining.   No A fib or A flutter Have any cardiac testing pending--NO Pt instructed to use Single[employer]com or GoodRx for a price reduction on prep   Patient's chart reviewed by Osvaldo Angst CNRA prior to previsit and patient appropriate for the [facility].  Previsit completed and red dot placed by patient's name on their procedure day (on provider's schedule).

## 2022-10-03 ENCOUNTER — Encounter: Payer: Self-pay | Admitting: Internal Medicine

## 2022-10-17 ENCOUNTER — Encounter: Payer: Self-pay | Admitting: Internal Medicine

## 2022-10-17 ENCOUNTER — Ambulatory Visit (AMBULATORY_SURGERY_CENTER): Payer: BC Managed Care – PPO | Admitting: Internal Medicine

## 2022-10-17 VITALS — BP 137/78 | HR 62 | Temp 97.8°F | Resp 10 | Ht 64.0 in | Wt 213.6 lb

## 2022-10-17 DIAGNOSIS — D123 Benign neoplasm of transverse colon: Secondary | ICD-10-CM

## 2022-10-17 DIAGNOSIS — D124 Benign neoplasm of descending colon: Secondary | ICD-10-CM | POA: Diagnosis not present

## 2022-10-17 DIAGNOSIS — D122 Benign neoplasm of ascending colon: Secondary | ICD-10-CM | POA: Diagnosis not present

## 2022-10-17 DIAGNOSIS — Z86010 Personal history of colonic polyps: Secondary | ICD-10-CM

## 2022-10-17 DIAGNOSIS — Z09 Encounter for follow-up examination after completed treatment for conditions other than malignant neoplasm: Secondary | ICD-10-CM | POA: Diagnosis not present

## 2022-10-17 MED ORDER — SODIUM CHLORIDE 0.9 % IV SOLN: 500.0000 mL | Freq: Once | INTRAVENOUS | Status: DC

## 2022-10-17 NOTE — Progress Notes (Signed)
GASTROENTEROLOGY PROCEDURE H&P NOTE   Primary Care Physician: Prince Solian, MD    Reason for Procedure:  History of colonic polyps  Plan:    Surveillance colonoscopy  Patient is appropriate for endoscopic procedure(s) in the ambulatory ([facility]) setting.  The nature of the procedure, as well as the risks, benefits, and alternatives were carefully and thoroughly reviewed with the patient. Ample time for discussion and questions allowed. The patient understood, was satisfied, and agreed to proceed.     HPI: Glenda Rocha is a 62 y.o. female who presents for surveillance colonoscopy.  Medical history as below.  Tolerated the prep.  No recent chest pain or shortness of breath.  No abdominal pain today.  Past Medical History:  Diagnosis Date   Allergy    Environmental allergies    GERD (gastroesophageal reflux disease)    Heart murmur    Hyperlipidemia    Hypertension    Tubular adenoma of colon 03/2013    Past Surgical History:  Procedure Laterality Date   COLONOSCOPY W/ POLYPECTOMY     UTERINE FIBROID SURGERY      Prior to Admission medications   Medication Sig Start Date End Date Taking? Authorizing Provider  Ascorbic Acid (VITAMIN C) 500 MG CAPS Take 1 capsule by mouth as needed (per pt during allergy season).   Yes [provider]  aspirin 325 MG EC tablet Take 325 mg by mouth daily.   Yes [provider]  cetirizine (ZYRTEC) 10 MG tablet Take 10 mg by mouth as needed.    Yes [provider]  guaiFENesin (MUCINEX) 600 MG 12 hr tablet Take 1,200 mg by mouth as needed for congestion.   Yes [provider]  omeprazole (PRILOSEC) 20 MG capsule Take 20 mg by mouth as needed.   Yes [provider]  OVER THE COUNTER MEDICATION megared omega3 krill oil   Yes [provider]  OVER THE COUNTER MEDICATION Melatonin, 3 mg one before bedtime as needed.   Yes [provider]  vitamin E 45 MG (100 UNITS) capsule Take 100  Units by mouth daily.   Yes [provider]  fluticasone (FLONASE) 50 MCG/ACT nasal spray Place 1 spray into both nostrils as needed for allergies.    [provider]  Multiple Vitamins-Minerals (ONE-A-DAY WOMENS 50 PLUS PO) Take 1 capsule by mouth daily.    [provider]  naproxen sodium (ALEVE) 220 MG tablet Take 220 mg by mouth as needed.    [provider]  OVER THE COUNTER MEDICATION [place] eye drops. Two drops daily in each eye.    [provider]    Current Outpatient Medications  Medication Sig Dispense Refill   Ascorbic Acid (VITAMIN C) 500 MG CAPS Take 1 capsule by mouth as needed (per pt during allergy season).     aspirin 325 MG EC tablet Take 325 mg by mouth daily.     cetirizine (ZYRTEC) 10 MG tablet Take 10 mg by mouth as needed.      guaiFENesin (MUCINEX) 600 MG 12 hr tablet Take 1,200 mg by mouth as needed for congestion.     omeprazole (PRILOSEC) 20 MG capsule Take 20 mg by mouth as needed.     OVER THE COUNTER MEDICATION megared omega3 krill oil     OVER THE COUNTER MEDICATION Melatonin, 3 mg one before bedtime as needed.     vitamin E 45 MG (100 UNITS) capsule Take 100 Units by mouth daily.  fluticasone (FLONASE) 50 MCG/ACT nasal spray Place 1 spray into both nostrils as needed for allergies.     Multiple Vitamins-Minerals (ONE-A-DAY WOMENS 50 PLUS PO) Take 1 capsule by mouth daily.     naproxen sodium (ALEVE) 220 MG tablet Take 220 mg by mouth as needed.     OVER THE COUNTER MEDICATION [place] eye drops. Two drops daily in each eye.     Current Facility-Administered Medications  Medication Dose Route Frequency Provider Last Rate Last Admin   0.9 %  sodium chloride infusion  500 mL Intravenous Once Neesha Langton, Lajuan Lines, MD        Allergies as of 10/17/2022 - Review Complete 10/17/2022  Allergen Reaction Noted   Corn oil  01/15/2021   Irbesartan  01/28/2022   Misc. sulfonamide containing compounds  09/28/2022   Mixed  feathers Other (See Comments) 11/07/2013   Molds & smuts  11/07/2013   Other  01/15/2021   Peanuts [peanut oil] Itching 03/22/2013   Pollen extract Cough and Itching 11/07/2013   Statins Other (See Comments) 09/28/2022   Sulfa antibiotics  03/22/2013   Sulfasalazine  03/22/2013   Telmisartan  01/12/2022   Tomato Itching 03/22/2013   Peanut-containing drug products Rash 11/07/2013    Family History  Problem Relation Age of Onset   Colon cancer Paternal Uncle 79   Colon cancer Paternal Grandmother 93   Esophageal cancer Cousin 59   Rectal cancer Neg Hx    Stomach cancer Neg Hx    Colon polyps Neg Hx     Social History   Socioeconomic History   Marital status: Married    Spouse name: Not on file   Number of children: Not on file   Years of education: Not on file   Highest education level: Not on file  Occupational History   Not on file  Tobacco Use   Smoking status: Never   Smokeless tobacco: Never  Vaping Use   Vaping Use: Never used  Substance and Sexual Activity   Alcohol use: Yes    Alcohol/week: 7.0 standard drinks of alcohol    Types: 7 Glasses of wine per week   Drug use: No   Sexual activity: Not on file  Other Topics Concern   Not on file  Social History Narrative   Not on file   Social Determinants of Health   Financial Resource Strain: Not on file  Food Insecurity: Not on file  Transportation Needs: Not on file  Physical Activity: Not on file  Stress: Not on file  Social Connections: Not on file  Intimate Partner Violence: Not on file    Physical Exam: Vital signs in last 24 hours: '@BP'$  (!) 145/64   Pulse 72   Temp 97.8 F (36.6 C)   Ht '5\' 4"'$  (1.626 m)   Wt 213 lb 9.6 oz (96.9 kg)   LMP 12/07/2014   SpO2 100%   BMI 36.66 kg/m  GEN: NAD EYE: Sclerae anicteric ENT: MMM CV: Non-tachycardic Pulm: CTA b/l GI: Soft, NT/ND NEURO:  Alert & Oriented x 3   [name], MD [facility]  10/17/2022 11:33 AM

## 2022-10-17 NOTE — Progress Notes (Signed)
Pt's states no medical or surgical changes since previsit or office visit. 

## 2022-10-17 NOTE — Progress Notes (Signed)
Sedate, gd SR's, VSS, report to RN 

## 2022-10-17 NOTE — Op Note (Signed)
[facility] Patient Name: Glenda Rocha Procedure Date: 10/17/2022 11:32 AM MRN: [id number] Endoscopist: Jerene Bears , MD, [id number] Age: 62 Referring MD:  Date of Birth: Oct 08, 1960 Gender: Female Account #: [id number] Procedure:                Colonoscopy Indications:              High risk colon cancer surveillance: Personal                            history of sessile serrated colon polyp (10 mm or                            greater in size) and tubular adenomas, Last                            colonoscopy: November 2020 (20 mm SSP x 1, TA x 4) Medicines:                Monitored Anesthesia Care Procedure:                Pre-Anesthesia Assessment:                           - Prior to the procedure, a History and Physical                            was performed, and patient medications and                            allergies were reviewed. The patient's tolerance of                            previous anesthesia was also reviewed. The risks                            and benefits of the procedure and the sedation                            options and risks were discussed with the patient.                            All questions were answered, and informed consent                            was obtained. Prior Anticoagulants: The patient has                            taken no anticoagulant or antiplatelet agents. ASA                            Grade Assessment: II - A patient with mild systemic                            disease. After reviewing the risks and benefits,  the patient was deemed in satisfactory condition to                            undergo the procedure.                           After obtaining informed consent, the colonoscope                            was passed under direct vision. Throughout the                            procedure, the patient's blood pressure, pulse, and                            oxygen saturations  were monitored continuously. The                            CF [id number] was introduced through the anus                            and advanced to the cecum, identified by                            appendiceal orifice and ileocecal valve. The                            colonoscopy was performed without difficulty. The                            patient tolerated the procedure well. The quality                            of the bowel preparation was excellent. The                            ileocecal valve, appendiceal orifice, and rectum                            were photographed. Scope In: 11:44:39 AM Scope Out: 12:01:07 PM Scope Withdrawal Time: 0 hours 14 minutes 24 seconds  Total Procedure Duration: 0 hours 16 minutes 28 seconds  Findings:                 The digital rectal exam was normal.                           A 2 mm polyp was found in the ascending colon. The                            polyp was sessile. The polyp was removed with a                            cold biopsy forceps. Resection and retrieval were  complete.                           A 5 mm polyp was found in the ascending colon. The                            polyp was sessile. The polyp was removed with a                            cold snare. Resection and retrieval were complete.                           A 5 mm polyp was found in the transverse colon. The                            polyp was sessile. The polyp was removed with a                            cold snare. Resection and retrieval were complete.                           A 3 mm polyp was found in the descending colon. The                            polyp was sessile. The polyp was removed with a                            cold biopsy forceps. Resection and retrieval were                            complete.                           The exam was otherwise without abnormality on                            direct and  retroflexion views. Complications:            No immediate complications. Estimated Blood Loss:     Estimated blood loss was minimal. Impression:               - One 2 mm polyp in the ascending colon, removed                            with a cold biopsy forceps. Resected and retrieved.                           - One 5 mm polyp in the ascending colon, removed                            with a cold snare. Resected and retrieved.                           - One  5 mm polyp in the transverse colon, removed                            with a cold snare. Resected and retrieved.                           - One 3 mm polyp in the descending colon, removed                            with a cold biopsy forceps. Resected and retrieved.                           - The examination was otherwise normal on direct                            and retroflexion views. Recommendation:           - Patient has a contact number available for                            emergencies. The signs and symptoms of potential                            delayed complications were discussed with the                            patient. Return to normal activities tomorrow.                            Written discharge instructions were provided to the                            patient.                           - Resume previous diet.                           - Continue present medications.                           - Await pathology results.                           - Repeat colonoscopy is recommended for                            surveillance. The colonoscopy date will be                            determined after pathology results from today's                            exam become available for review. Jerene Bears, MD 10/17/2022 12:08:25 PM This report has been signed electronically.

## 2022-10-17 NOTE — Patient Instructions (Signed)
Discharge instructions given. Handout on polyps. Resume previous medications. YOU HAD AN ENDOSCOPIC PROCEDURE TODAY AT THE [facility]:   Refer to the procedure report that was given to you for any specific questions about what was found during the examination.  If the procedure report does not answer your questions, please call your gastroenterologist to clarify.  If you requested that your care partner not be given the details of your procedure findings, then the procedure report has been included in a sealed envelope for you to review at your convenience later.  YOU SHOULD EXPECT: Some feelings of bloating in the abdomen. Passage of more gas than usual.  Walking can help get rid of the air that was put into your GI tract during the procedure and reduce the bloating. If you had a lower endoscopy (such as a colonoscopy or flexible sigmoidoscopy) you may notice spotting of blood in your stool or on the toilet paper. If you underwent a bowel prep for your procedure, you may not have a normal bowel movement for a few days.  Please Note:  You might notice some irritation and congestion in your nose or some drainage.  This is from the oxygen used during your procedure.  There is no need for concern and it should clear up in a day or so.  SYMPTOMS TO REPORT IMMEDIATELY:  Following lower endoscopy (colonoscopy or flexible sigmoidoscopy):  Excessive amounts of blood in the stool  Significant tenderness or worsening of abdominal pains  Swelling of the abdomen that is new, acute  Fever of 100F or higher   For urgent or emergent issues, a gastroenterologist can be reached at any hour by calling [phone]. Do not use MyChart messaging for urgent concerns.    DIET:  We do recommend a small meal at first, but then you may proceed to your regular diet.  Drink plenty of fluids but you should avoid alcoholic beverages for 24 hours.  ACTIVITY:  You should plan to take it easy for the rest  of today and you should NOT DRIVE or use heavy machinery until tomorrow (because of the sedation medicines used during the test).    FOLLOW UP: Our staff will call the number listed on your records the next business day following your procedure.  We will call around 7:15- 8:00 am to check on you and address any questions or concerns that you may have regarding the information given to you following your procedure. If we do not reach you, we will leave a message.     If any biopsies were taken you will be contacted by phone or by letter within the next 1-3 weeks.  Please call us at [phone] if you have not heard about the biopsies in 3 weeks.    SIGNATURES/CONFIDENTIALITY: You and/or your care partner have signed paperwork which will be entered into your electronic medical record.  These signatures attest to the fact that that the information above on your After Visit Summary has been reviewed and is understood.  Full responsibility of the confidentiality of this discharge information lies with you and/or your care-partner.   

## 2022-10-18 ENCOUNTER — Telehealth: Payer: Self-pay

## 2022-10-18 NOTE — Telephone Encounter (Signed)
No answer, left message to call if having and issues or concerns, B.Zykira Matlack RN.

## 2022-10-24 ENCOUNTER — Encounter: Payer: Self-pay | Admitting: Internal Medicine

## 2022-10-26 DIAGNOSIS — H40053 Ocular hypertension, bilateral: Secondary | ICD-10-CM | POA: Diagnosis not present

## 2022-11-16 DIAGNOSIS — J302 Other seasonal allergic rhinitis: Secondary | ICD-10-CM | POA: Diagnosis not present

## 2022-11-16 DIAGNOSIS — B9689 Other specified bacterial agents as the cause of diseases classified elsewhere: Secondary | ICD-10-CM | POA: Diagnosis not present

## 2022-11-16 DIAGNOSIS — J988 Other specified respiratory disorders: Secondary | ICD-10-CM | POA: Diagnosis not present

## 2022-12-19 DIAGNOSIS — H35371 Puckering of macula, right eye: Secondary | ICD-10-CM | POA: Diagnosis not present

## 2022-12-19 DIAGNOSIS — H40053 Ocular hypertension, bilateral: Secondary | ICD-10-CM | POA: Diagnosis not present

## 2022-12-19 DIAGNOSIS — H35341 Macular cyst, hole, or pseudohole, right eye: Secondary | ICD-10-CM | POA: Diagnosis not present

## 2022-12-19 DIAGNOSIS — H43811 Vitreous degeneration, right eye: Secondary | ICD-10-CM | POA: Diagnosis not present

## 2023-04-12 DIAGNOSIS — I10 Essential (primary) hypertension: Secondary | ICD-10-CM | POA: Diagnosis not present

## 2023-04-14 ENCOUNTER — Telehealth: Payer: Self-pay | Admitting: Internal Medicine

## 2023-04-14 NOTE — Telephone Encounter (Signed)
Patient called requesting to speak with a nurse said she feels like she has a lump on her throat.

## 2023-04-14 NOTE — Telephone Encounter (Signed)
Pt returned call to patient. Pt wanted to schedule an appt. Pt reports that she has had a globus sensation for about a year and a half. Pt states that she "feels like there is a frog in her throat". Pt does not feel a knot when she touches the outside of her throat. Pt denies any pain with swallowing. Pt was restarted on Omeprazole 20 mg daily, I advised pt to continue medication until her appt on 05/17/23 at 10:30 am with Alcide Evener, NP. Pt also wanted to mention that her father recently passed from pancreatic cancer and that one of the signs is feeling like there is a lump in your throat. This information has been added to appt info. Pt verbalized understanding and had no concerns at the end of the call.

## 2023-04-14 NOTE — Telephone Encounter (Signed)
Lm on vm for patient to return call 

## 2023-05-10 DIAGNOSIS — J069 Acute upper respiratory infection, unspecified: Secondary | ICD-10-CM | POA: Diagnosis not present

## 2023-05-14 DIAGNOSIS — J40 Bronchitis, not specified as acute or chronic: Secondary | ICD-10-CM | POA: Diagnosis not present

## 2023-05-14 DIAGNOSIS — R059 Cough, unspecified: Secondary | ICD-10-CM | POA: Diagnosis not present

## 2023-05-17 ENCOUNTER — Ambulatory Visit: Payer: BC Managed Care – PPO | Admitting: Nurse Practitioner

## 2023-09-22 DIAGNOSIS — Z1231 Encounter for screening mammogram for malignant neoplasm of breast: Secondary | ICD-10-CM | POA: Diagnosis not present

## 2023-09-22 DIAGNOSIS — Z6824 Body mass index (BMI) 24.0-24.9, adult: Secondary | ICD-10-CM | POA: Diagnosis not present

## 2023-09-22 DIAGNOSIS — Z01419 Encounter for gynecological examination (general) (routine) without abnormal findings: Secondary | ICD-10-CM | POA: Diagnosis not present

## 2023-09-22 DIAGNOSIS — Z800 Family history of malignant neoplasm of digestive organs: Secondary | ICD-10-CM | POA: Diagnosis not present

## 2023-09-22 DIAGNOSIS — Z860101 Personal history of adenomatous and serrated colon polyps: Secondary | ICD-10-CM | POA: Diagnosis not present

## 2023-09-22 DIAGNOSIS — Z124 Encounter for screening for malignant neoplasm of cervix: Secondary | ICD-10-CM | POA: Diagnosis not present

## 2023-09-22 DIAGNOSIS — Z8051 Family history of malignant neoplasm of kidney: Secondary | ICD-10-CM | POA: Diagnosis not present

## 2023-09-22 DIAGNOSIS — Z808 Family history of malignant neoplasm of other organs or systems: Secondary | ICD-10-CM | POA: Diagnosis not present

## 2023-09-28 DIAGNOSIS — H35371 Puckering of macula, right eye: Secondary | ICD-10-CM | POA: Diagnosis not present

## 2023-09-28 DIAGNOSIS — H40053 Ocular hypertension, bilateral: Secondary | ICD-10-CM | POA: Diagnosis not present

## 2023-09-28 DIAGNOSIS — H35341 Macular cyst, hole, or pseudohole, right eye: Secondary | ICD-10-CM | POA: Diagnosis not present

## 2023-09-29 DIAGNOSIS — E785 Hyperlipidemia, unspecified: Secondary | ICD-10-CM | POA: Diagnosis not present

## 2023-10-02 DIAGNOSIS — Z1212 Encounter for screening for malignant neoplasm of rectum: Secondary | ICD-10-CM | POA: Diagnosis not present

## 2023-10-02 DIAGNOSIS — Z1389 Encounter for screening for other disorder: Secondary | ICD-10-CM | POA: Diagnosis not present

## 2023-10-06 DIAGNOSIS — Z1331 Encounter for screening for depression: Secondary | ICD-10-CM | POA: Diagnosis not present

## 2023-10-06 DIAGNOSIS — Z1339 Encounter for screening examination for other mental health and behavioral disorders: Secondary | ICD-10-CM | POA: Diagnosis not present

## 2023-10-06 DIAGNOSIS — I10 Essential (primary) hypertension: Secondary | ICD-10-CM | POA: Diagnosis not present

## 2023-10-06 DIAGNOSIS — R82998 Other abnormal findings in urine: Secondary | ICD-10-CM | POA: Diagnosis not present

## 2023-10-06 DIAGNOSIS — Z0000 Encounter for general adult medical examination without abnormal findings: Secondary | ICD-10-CM | POA: Diagnosis not present

## 2023-10-26 DIAGNOSIS — M7062 Trochanteric bursitis, left hip: Secondary | ICD-10-CM | POA: Diagnosis not present

## 2023-11-01 DIAGNOSIS — Z800 Family history of malignant neoplasm of digestive organs: Secondary | ICD-10-CM | POA: Diagnosis not present

## 2024-05-27 DIAGNOSIS — H35371 Puckering of macula, right eye: Secondary | ICD-10-CM | POA: Diagnosis not present

## 2024-05-27 DIAGNOSIS — H35341 Macular cyst, hole, or pseudohole, right eye: Secondary | ICD-10-CM | POA: Diagnosis not present

## 2024-05-27 DIAGNOSIS — H25013 Cortical age-related cataract, bilateral: Secondary | ICD-10-CM | POA: Diagnosis not present

## 2024-05-27 DIAGNOSIS — H40053 Ocular hypertension, bilateral: Secondary | ICD-10-CM | POA: Diagnosis not present
